# Patient Record
Sex: Female | Born: 1935 | Race: White | Hispanic: No | Marital: Single
Health system: Southern US, Community
[De-identification: ages and names within clinical notes are randomized; demographics above are authoritative.]

---

## 2004-05-27 ENCOUNTER — Ambulatory Visit: Payer: Self-pay | Admitting: Internal Medicine

## 2004-06-13 ENCOUNTER — Other Ambulatory Visit: Payer: Self-pay

## 2004-06-13 ENCOUNTER — Ambulatory Visit: Payer: Self-pay | Admitting: Internal Medicine

## 2005-02-06 ENCOUNTER — Ambulatory Visit: Payer: Self-pay | Admitting: Internal Medicine

## 2005-02-06 ENCOUNTER — Inpatient Hospital Stay: Payer: Self-pay | Admitting: Internal Medicine

## 2005-02-11 ENCOUNTER — Other Ambulatory Visit: Payer: Self-pay

## 2005-02-12 ENCOUNTER — Other Ambulatory Visit: Payer: Self-pay

## 2005-06-10 ENCOUNTER — Ambulatory Visit: Payer: Self-pay | Admitting: Internal Medicine

## 2005-07-03 ENCOUNTER — Ambulatory Visit: Payer: Self-pay | Admitting: Internal Medicine

## 2005-07-16 ENCOUNTER — Ambulatory Visit: Payer: Self-pay | Admitting: General Surgery

## 2005-07-27 ENCOUNTER — Inpatient Hospital Stay: Payer: Self-pay | Admitting: General Surgery

## 2005-08-01 ENCOUNTER — Other Ambulatory Visit: Payer: Self-pay

## 2005-08-02 ENCOUNTER — Other Ambulatory Visit: Payer: Self-pay

## 2005-08-18 ENCOUNTER — Emergency Department: Payer: Self-pay | Admitting: Internal Medicine

## 2005-08-18 ENCOUNTER — Other Ambulatory Visit: Payer: Self-pay

## 2005-08-27 ENCOUNTER — Ambulatory Visit: Payer: Self-pay | Admitting: Internal Medicine

## 2005-11-14 ENCOUNTER — Other Ambulatory Visit: Payer: Self-pay

## 2005-11-14 ENCOUNTER — Emergency Department: Payer: Self-pay | Admitting: Emergency Medicine

## 2006-07-13 ENCOUNTER — Ambulatory Visit: Payer: Self-pay | Admitting: Internal Medicine

## 2006-12-07 ENCOUNTER — Other Ambulatory Visit: Payer: Self-pay

## 2006-12-08 ENCOUNTER — Inpatient Hospital Stay: Payer: Self-pay | Admitting: Internal Medicine

## 2007-03-16 ENCOUNTER — Inpatient Hospital Stay: Payer: Self-pay | Admitting: General Surgery

## 2007-03-18 ENCOUNTER — Other Ambulatory Visit: Payer: Self-pay

## 2007-07-15 ENCOUNTER — Ambulatory Visit: Payer: Self-pay | Admitting: Internal Medicine

## 2007-07-27 IMAGING — CT CT ABD-PELV W/ CM
1 of 2 series · 15 of 32 positions shown, 19 images · non-contrast
Comparison: none

REASON FOR EXAM: (1) Lt lower q abscess/diverticulitis Follow up; (2) Abscee
COMMENTS:

[Series 2: abdomen · axial · 0.59mm/px · z∈[-1056,-672]mm · 15 of 53 slices shown, 19 images]
[im 3/53  soft-tissue]
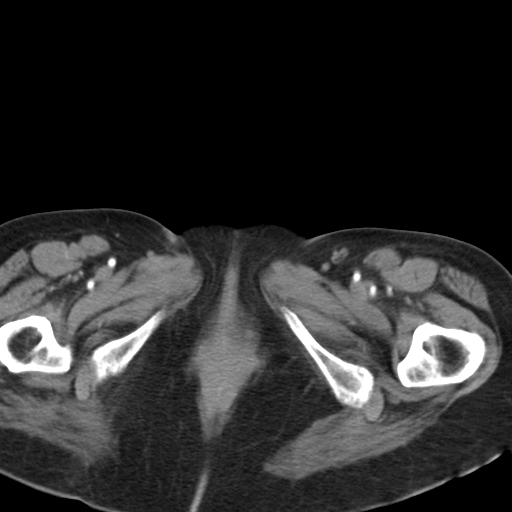
[im 3/53  bone]
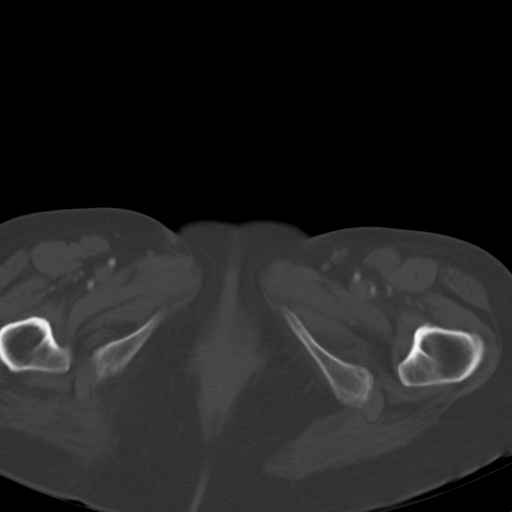
[im 7/53  soft-tissue]
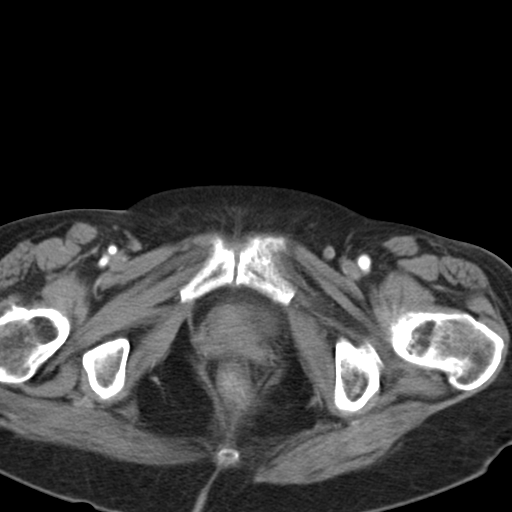
[im 11/53  soft-tissue]
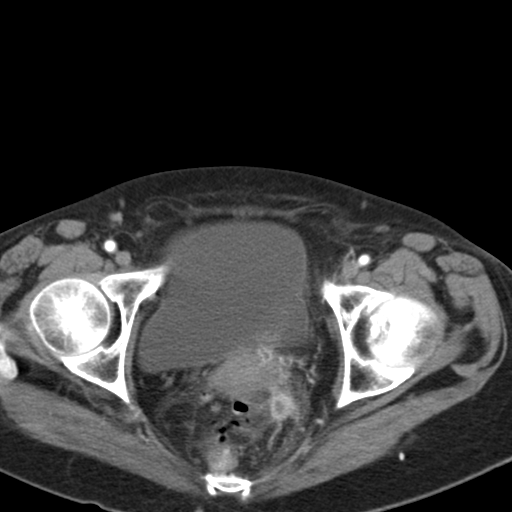
[im 15/53  soft-tissue]
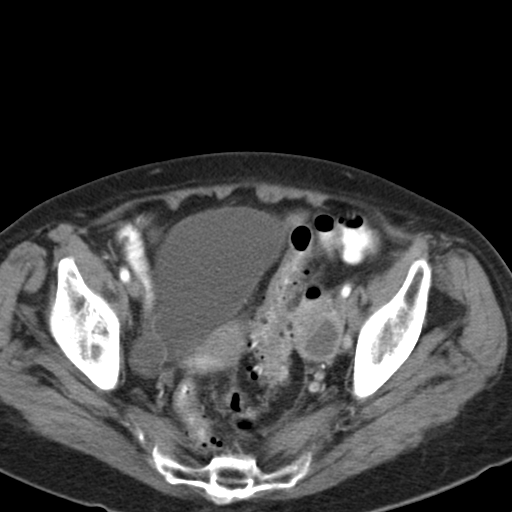
[im 19/53  soft-tissue]
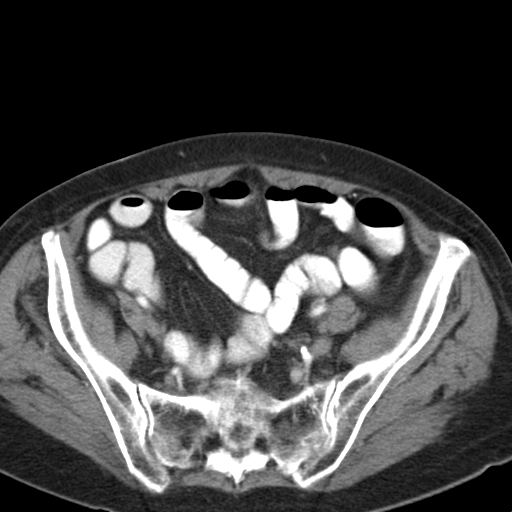
[im 23/53  soft-tissue]
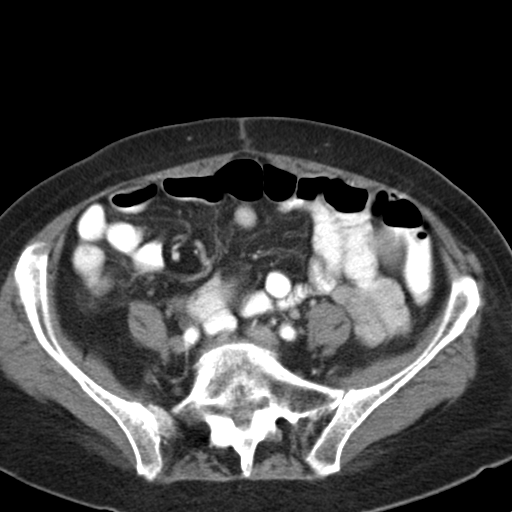
[im 27/53  soft-tissue]
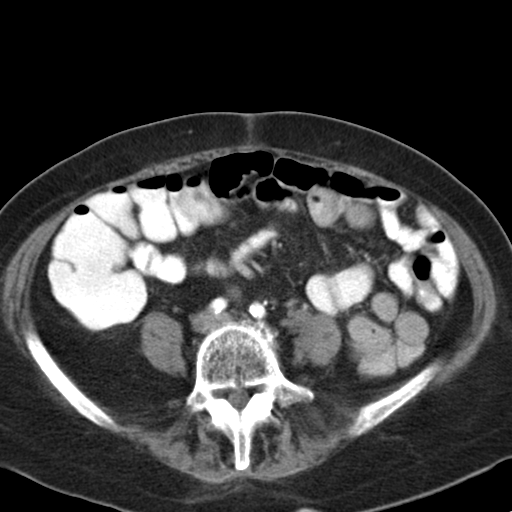
[im 31/53  soft-tissue]
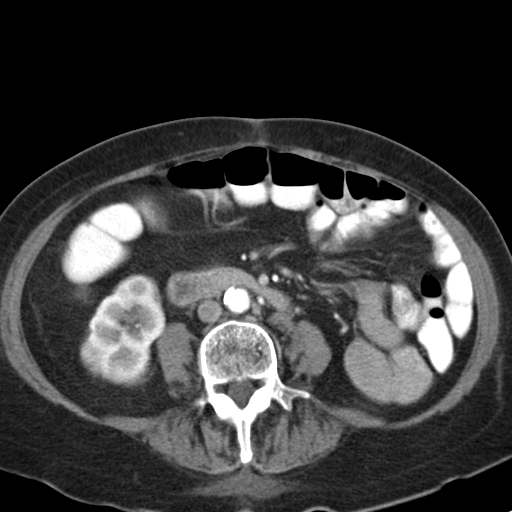
[im 35/53  soft-tissue]
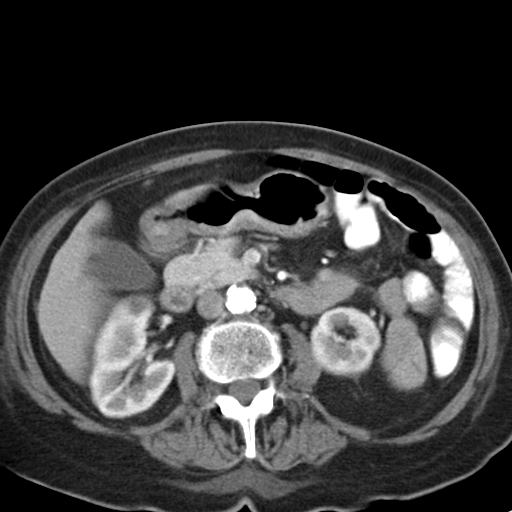
[im 35/53  bone]
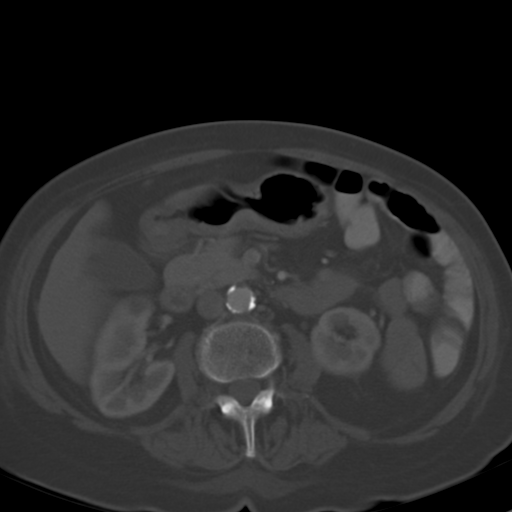
[im 39/53  soft-tissue]
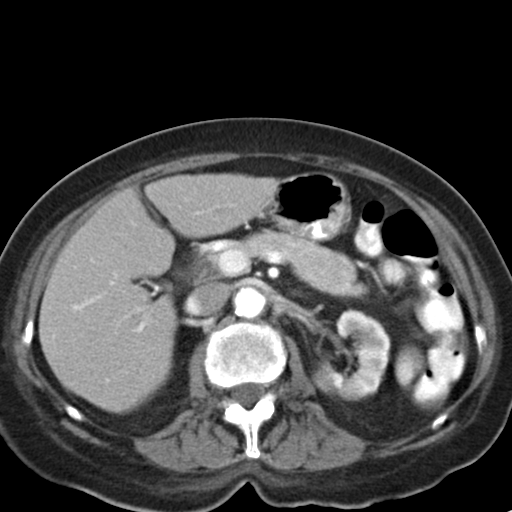
[im 43/53  soft-tissue]
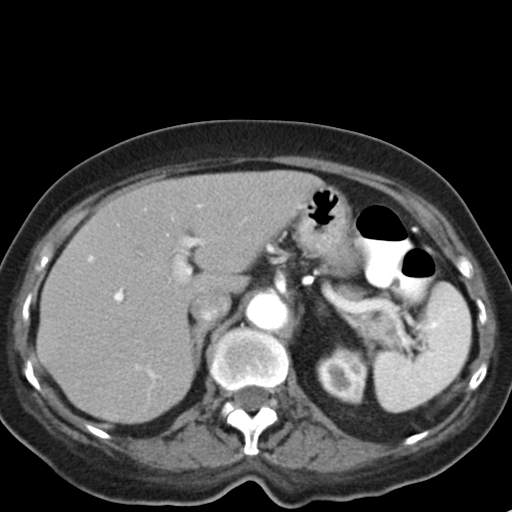
[im 45/53  lung]
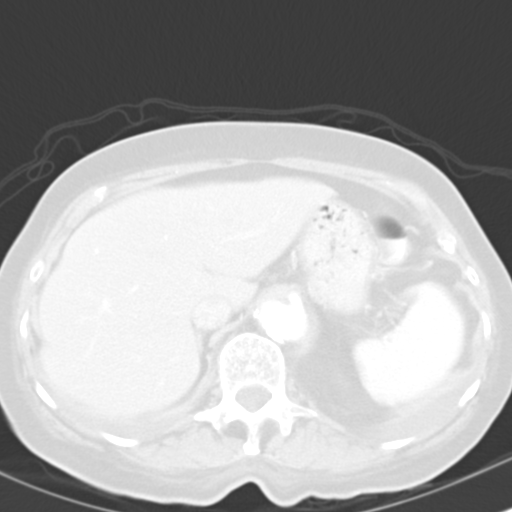
[im 47/53  soft-tissue]
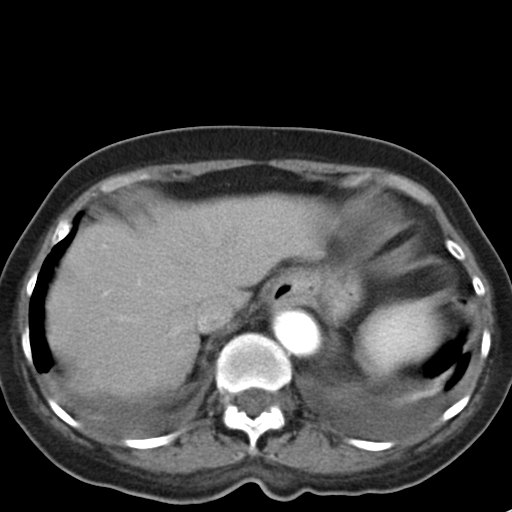
[im 47/53  lung]
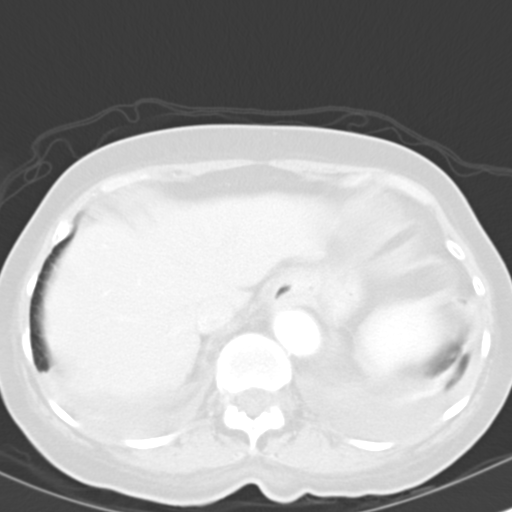
[im 49/53  lung]
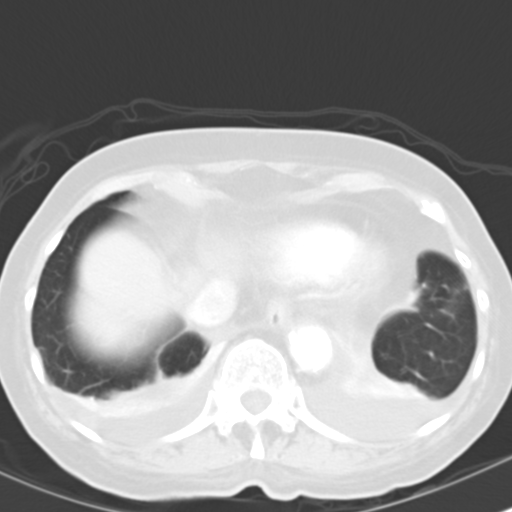
[im 51/53  soft-tissue]
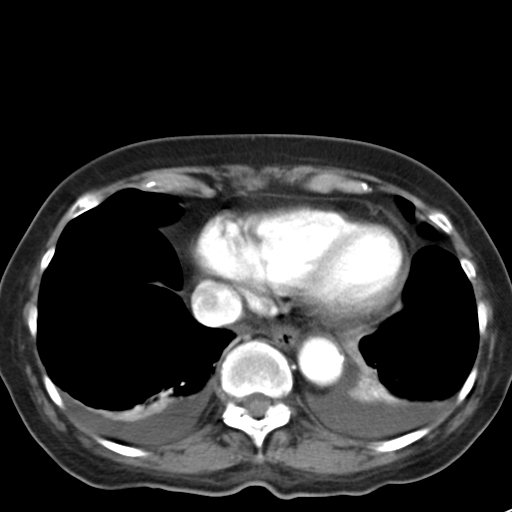
[im 51/53  lung]
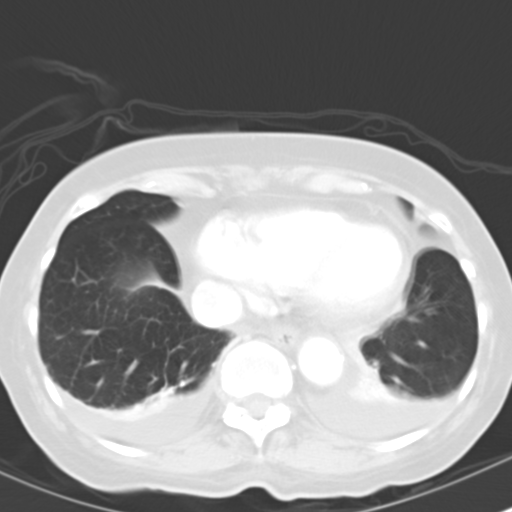

[15 of 32 positions shown; findings below may reference images not displayed]

PROCEDURE:     CT  - CT ABDOMEN / PELVIS  W  - February 19, 2005  [DATE]

RESULT:     Comparison is made to study 12 February, 2005.

Since the prior study the bilateral pleural effusions have increased in
size.  There is atelectasis in the adjacent lower lobes of each lung.  In
the pelvis there is evidence of sigmoid diverticulosis and lateral to the
sigmoid colon there is a fluid and gas-filled structure seen that is
consistent with the patient's known abscess. It measures approximately 2.5 x
3 cm in diameter with an additional area measuring approximately 1.5 x 2 cm
closely applied to it.  Overall, the appearance of the pelvic structures is
felt to be slowly improving.  I do not see free fluid in the cul-de-sac
region. The uterus is small, appropriate for age. The urinary bladder is
moderately distended and grossly normal.

The partially contrast filled loops of small and large bowel outside of the
sigmoid region appeared normal. The liver, gallbladder, non-distended
stomach, pancreas, spleen, and adrenal glands are normal in appearance. The
caliber of the abdominal aorta is grossly normal. The kidneys enhance well
and exhibit no evidence of obstruction.

The lung bases reveal the aforementioned basilar atelectasis with pleural
effusions that remain small, but which have increased in size since the
prior study.
IMPRESSION: 1)There remains an abscess in the LEFT aspect of the pelvis consistent with
the clinically known diverticular abscess.  The overall degree of
inflammation does appear to be decreasing.  No free fluid is seen and there
is no bowel obstruction identified.

2)There are small bilateral pleural effusions which have increased in size
since the prior study and there is basilar atelectasis bilaterally that is
also somewhat more conspicuous.

## 2007-07-31 IMAGING — CR DG CHEST 2V
1 series · 2 of 2 positions shown · non-contrast
Comparison: none

REASON FOR EXAM: Pleural effusions
COMMENTS:

[Series 3918: lateral · 0.22mm/px · 2 of 2 slices shown]
[im 1/2]
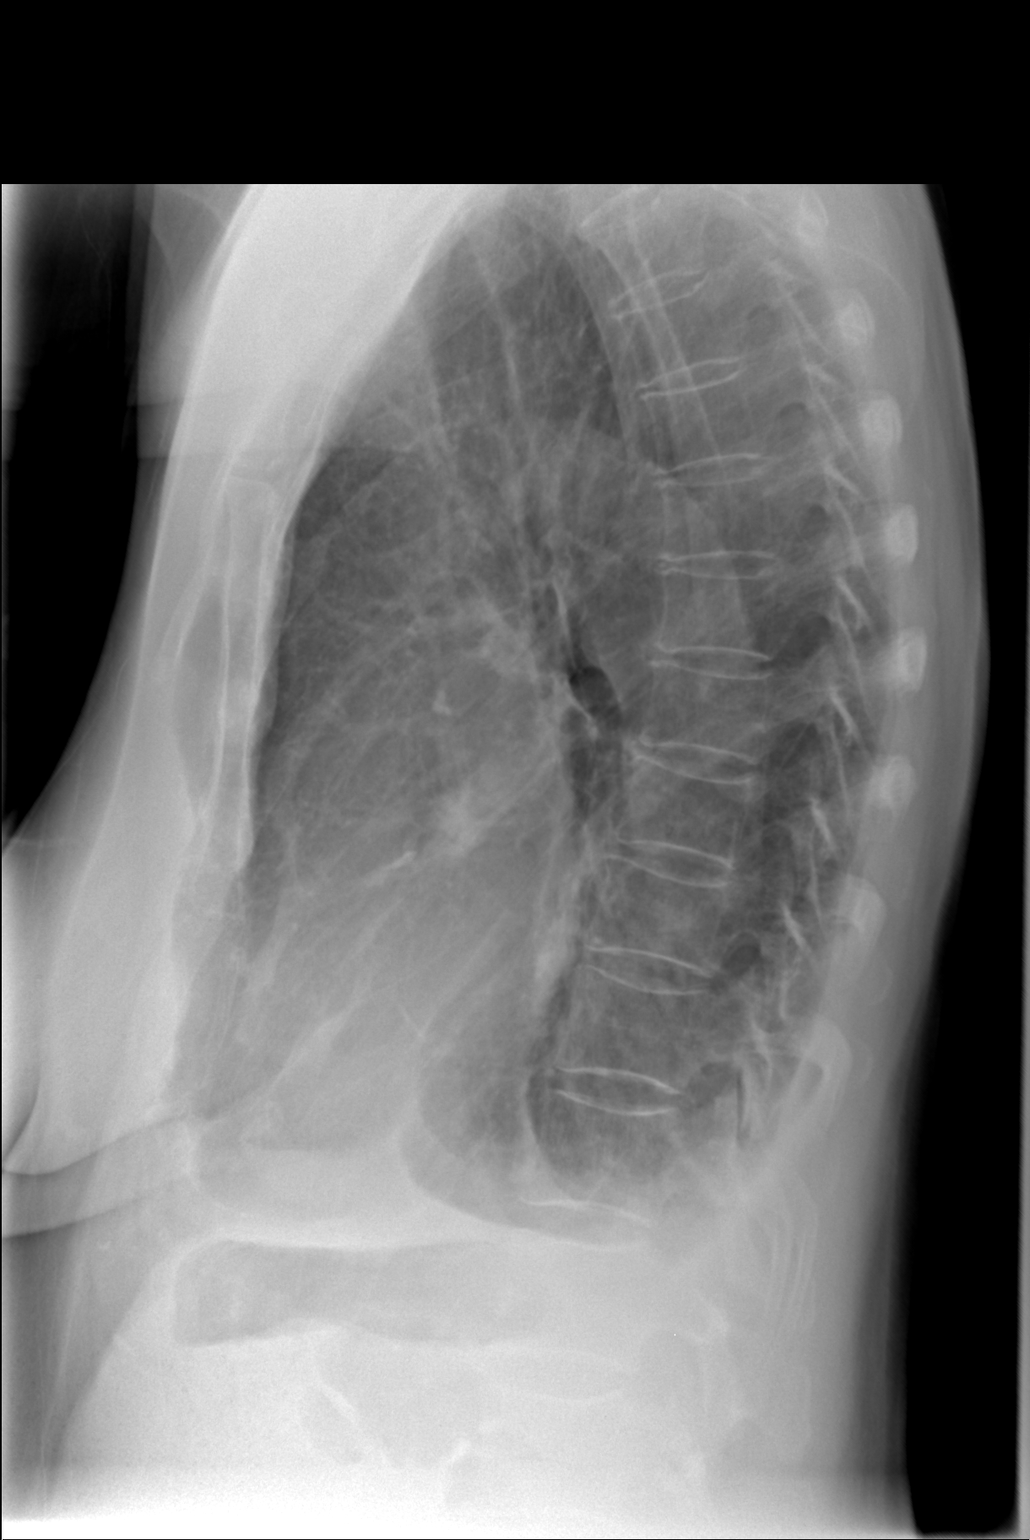
[im 2/2]
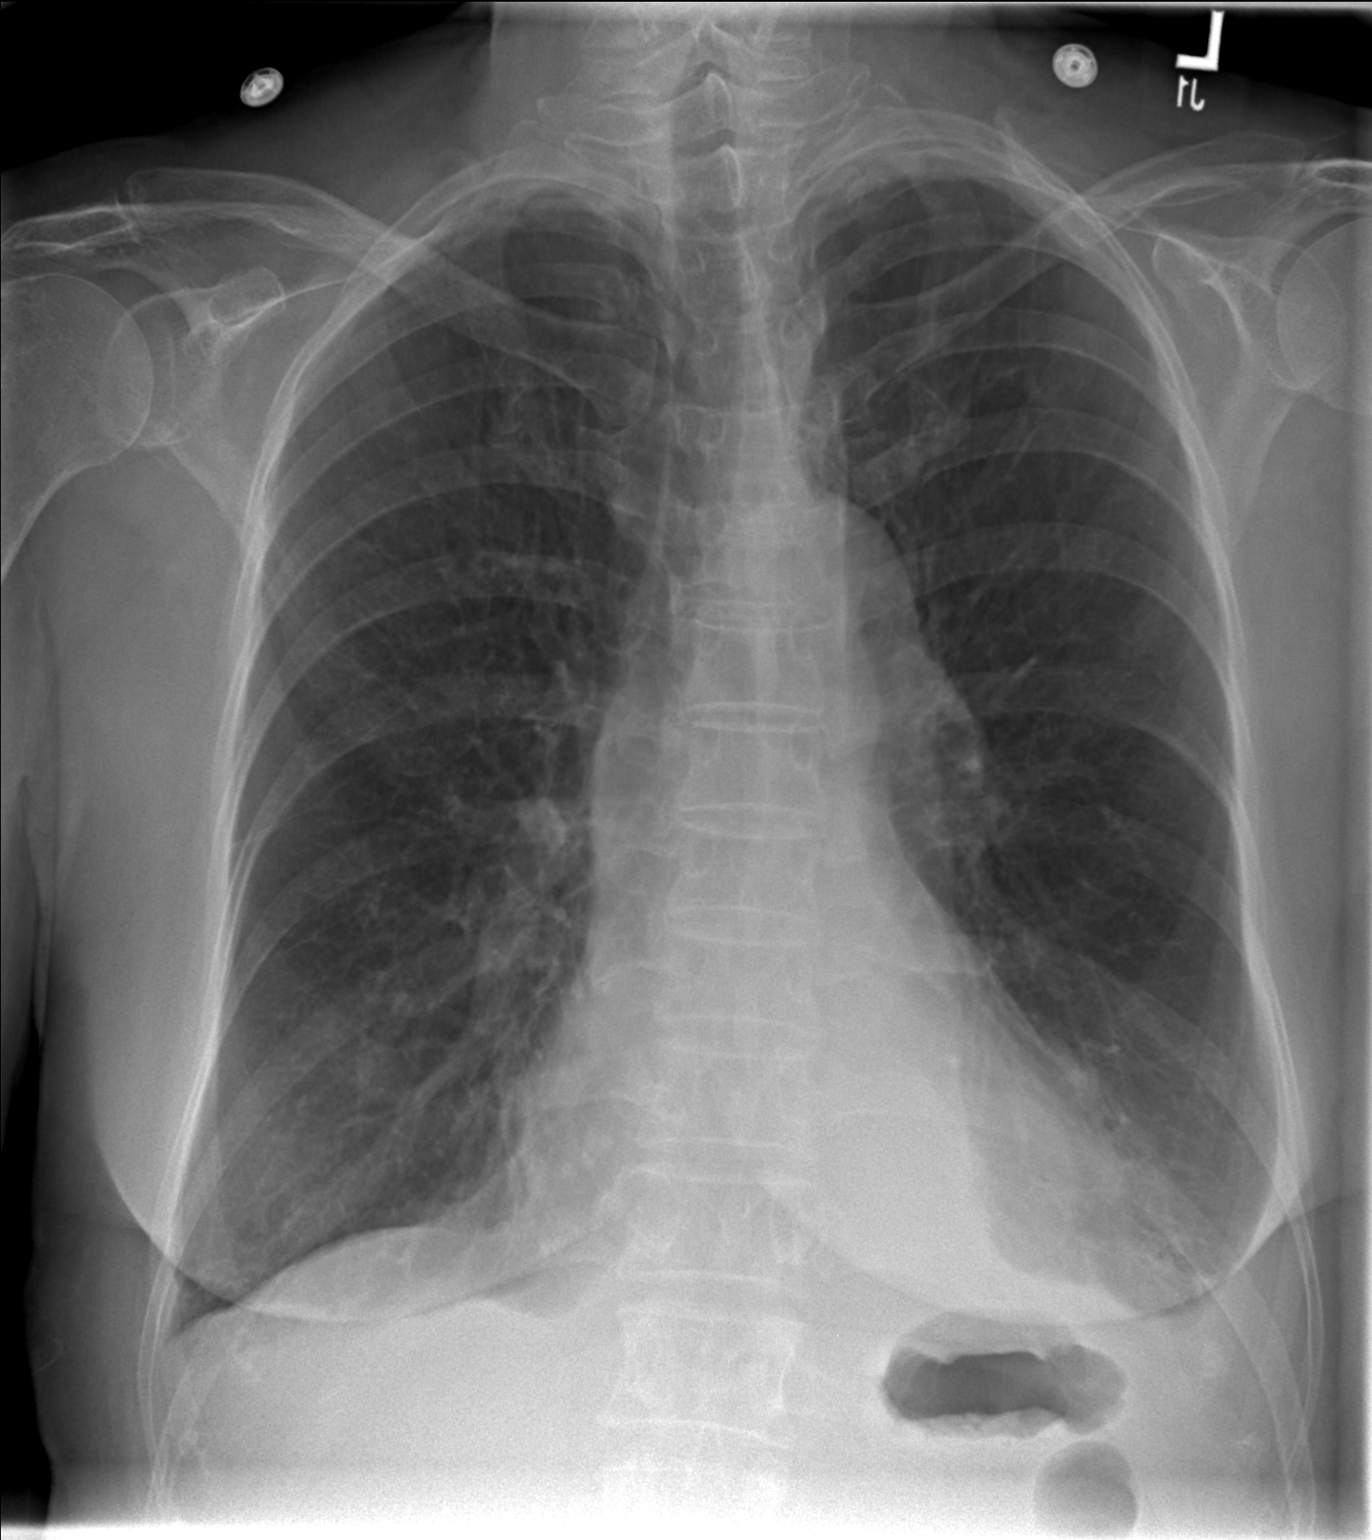

[2 of 2 positions shown; findings below may reference images not displayed]

PROCEDURE:     DXR - DXR CHEST PA (OR AP) AND LATERAL  - February 23, 2005  [DATE]

RESULT:     The lungs are hyperinflated. There is blunting of the LEFT
costophrenic angle. No focal regions of consolidation are demonstrated. The
cardiac silhouette is moderately enlarged. The visualized bony skeleton
demonstrates no evidence of fracture or dislocation.
IMPRESSION: 1.     Findings consistent with COPD.
2.     Blunting of the LEFT costophrenic angle likely representing a small
effusion though chronic scar cannot be completely excluded but this appears
to have occurred in the interim when compared to the previous study dated
02/13/2005.
[DATE].     No further focal regions of consolidation or focal infiltrates are
appreciated.

## 2007-12-21 IMAGING — CT CT ABD-PELV W/ CM
1 of 2 series · 15 of 32 positions shown, 19 images · non-contrast
Comparison: none

REASON FOR EXAM: Pelvic abscess
COMMENTS:

[Series 2: abdomen · axial · 0.70mm/px · z∈[+86,+462]mm · 15 of 53 slices shown, 19 images]
[im 3/53  soft-tissue]
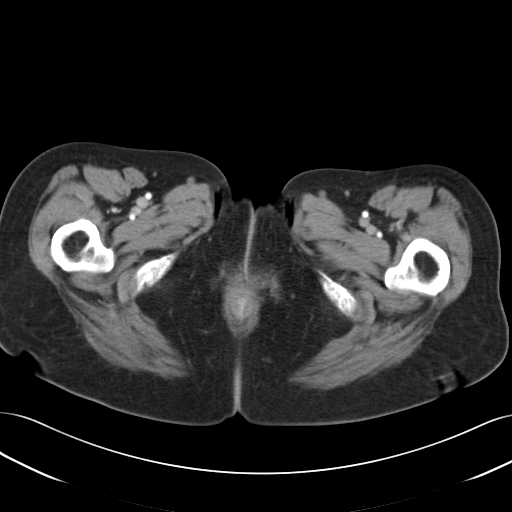
[im 3/53  bone]
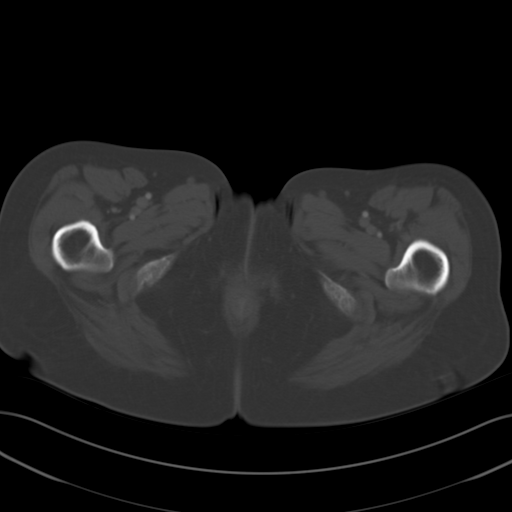
[im 7/53  soft-tissue]
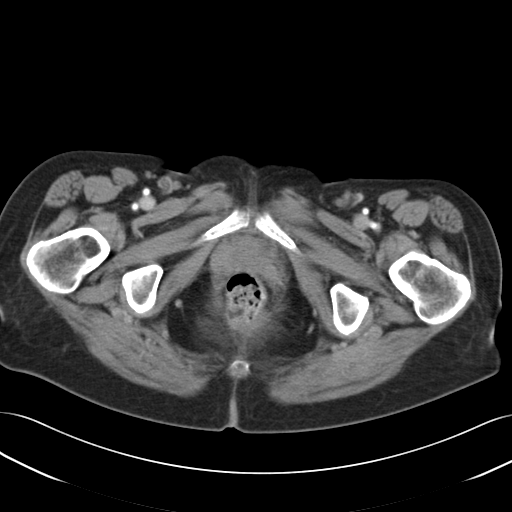
[im 11/53  soft-tissue]
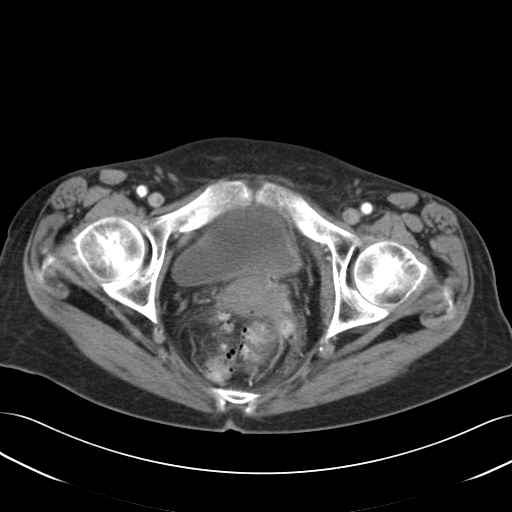
[im 15/53  soft-tissue]
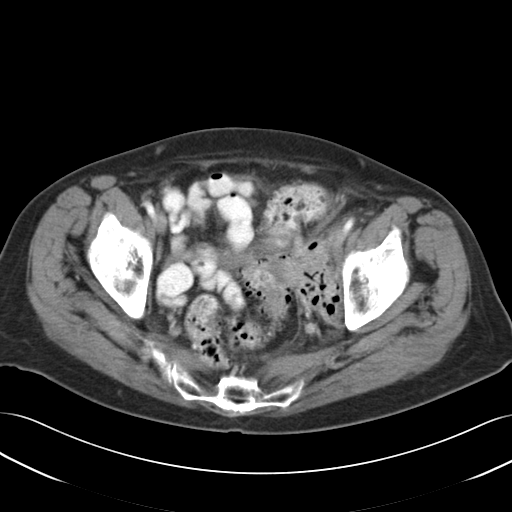
[im 19/53  soft-tissue]
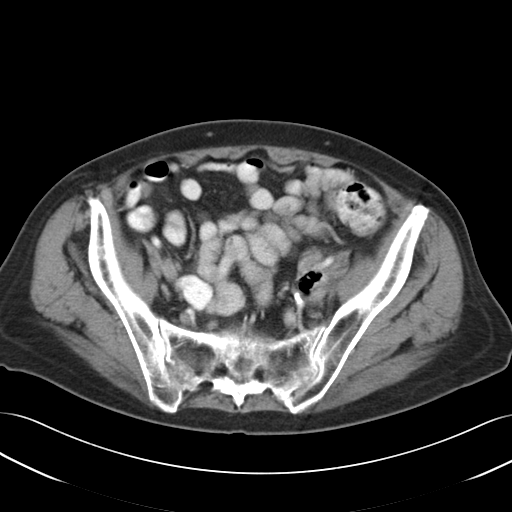
[im 23/53  soft-tissue]
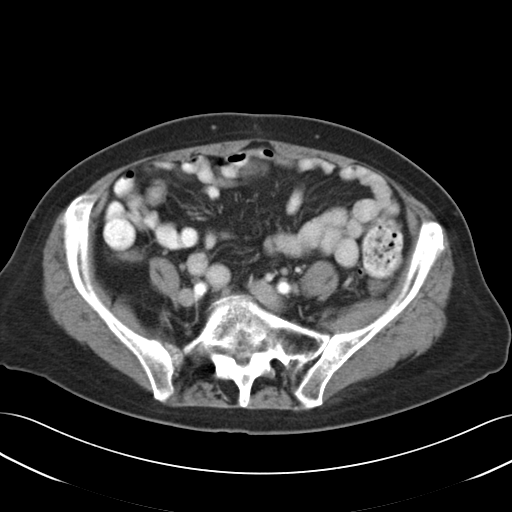
[im 28/53  soft-tissue]
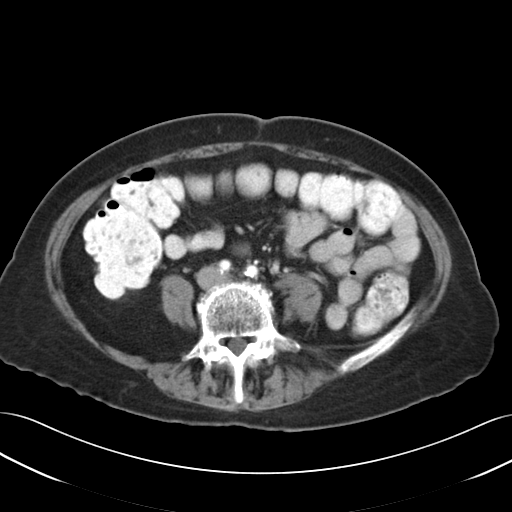
[im 30/53  soft-tissue]
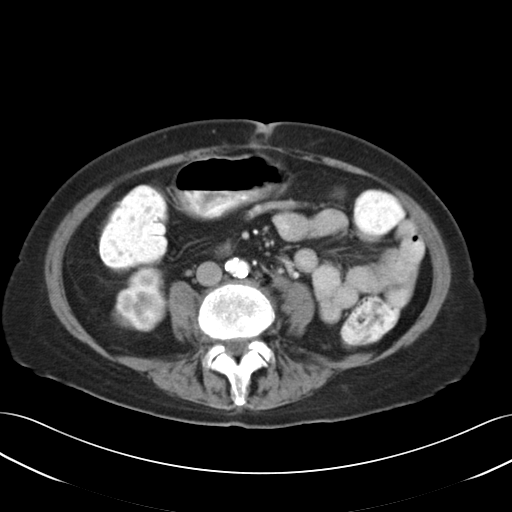
[im 34/53  soft-tissue]
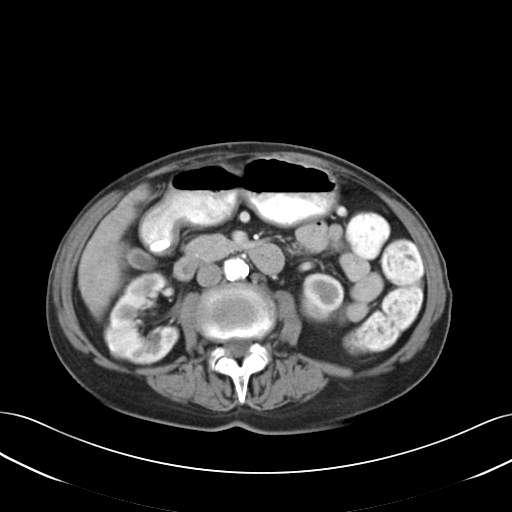
[im 34/53  bone]
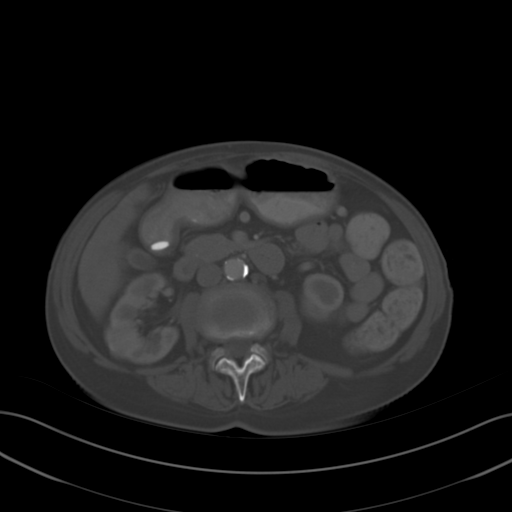
[im 38/53  soft-tissue]
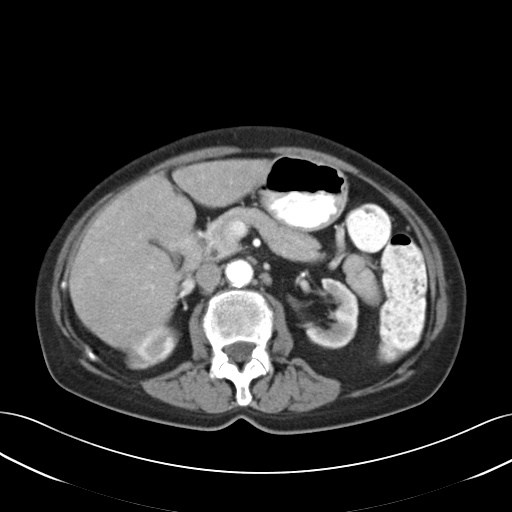
[im 42/53  soft-tissue]
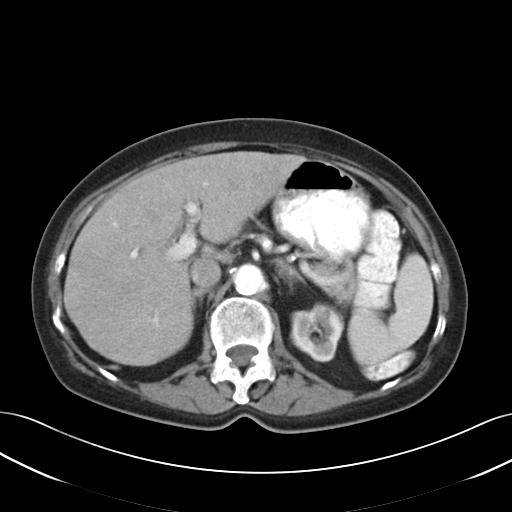
[im 44/53  lung]
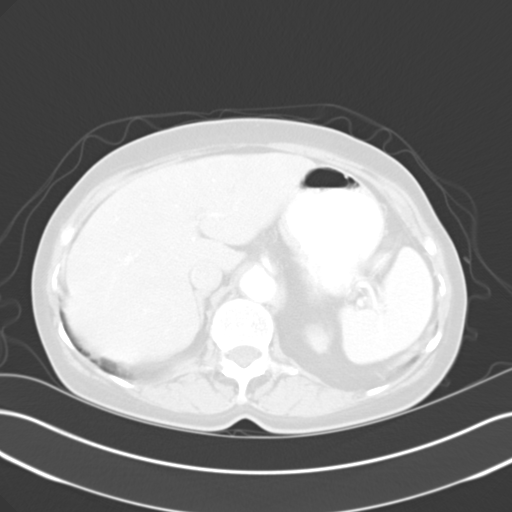
[im 46/53  soft-tissue]
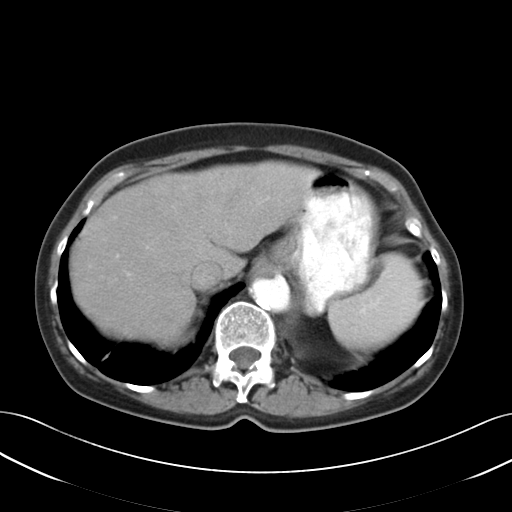
[im 46/53  lung]
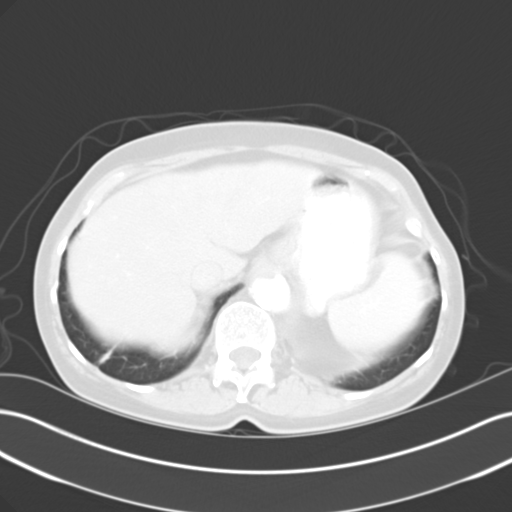
[im 48/53  lung]
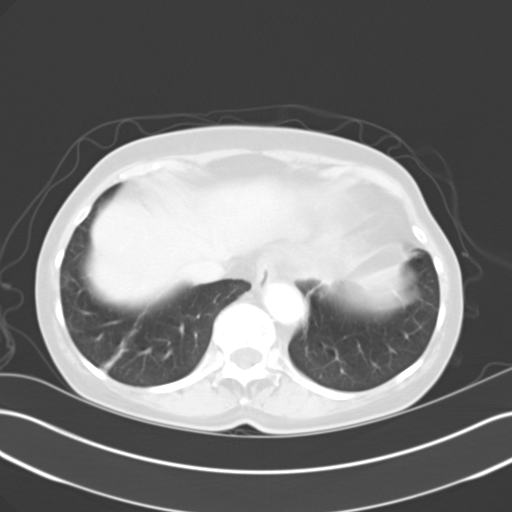
[im 50/53  soft-tissue]
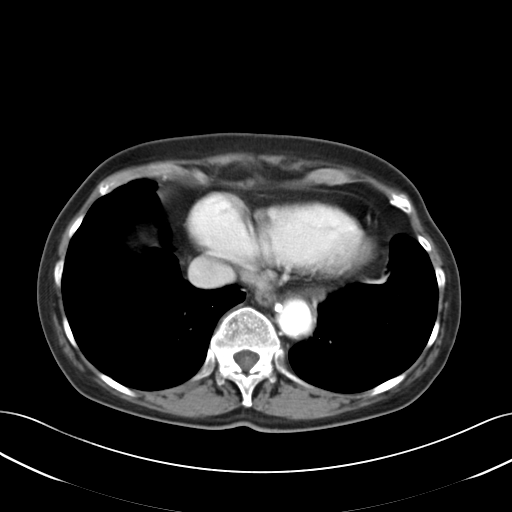
[im 50/53  lung]
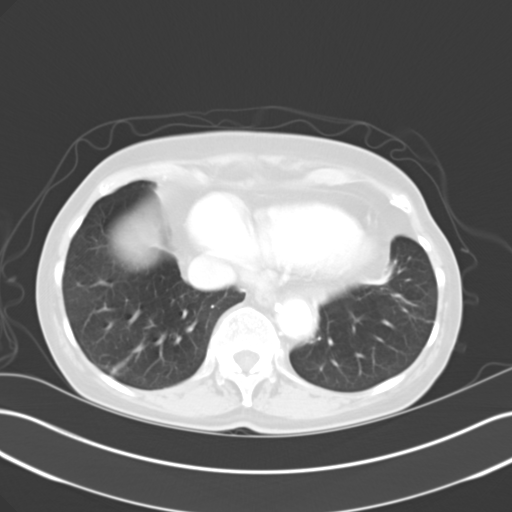

[15 of 32 positions shown; findings below may reference images not displayed]

PROCEDURE:     CT  - CT ABDOMEN / PELVIS  W  - July 16, 2005  [DATE]

RESULT:        The patient is being evaluated for known diverticular
abscess.  Comparison is made to a CT of the abdomen 10 June, 2005.

In the LEFT adnexal region there remains a poorly marginated collection of
air and soft tissue density that is little changed from the prior study.  It
is closely applied to the LEFT aspect of the uterine fundus and to the LEFT
aspect of the urinary bladder.  It measures approximately 6.0 cm AP x
approximately 4.2 cm transversely and extends longitudinally for a distance
of approximately 7.0 cm.  Overall, it does not appear significantly changed
since the prior study.  I see no free fluid more inferiorly in the pelvis.
Numerous sigmoid diverticula are present.  No obstructive changes are
demonstrated.

The remainder of the partially contrast-filled loops of small and large
bowel are normal in appearance.  There is a small hiatal hernia.  The
stomach, liver, spleen, adrenal glands and pancreas exhibit no acute
abnormality.  The kidneys enhance well and demonstrate age-appropriate
cortical thinning.   The caliber of the abdominal aorta is normal.  There is
calcification within its wall.  I see no paraaortic nor pericaval
lymphadenopathy.  The lung bases exhibit no acute abnormality.
IMPRESSION: 1.     There are findings consistent with a persistent, stable appearing,
diverticular abscess in the LEFT adnexal region.  There is no evidence of
obstruction nor any high-grade mass effect upon adjacent normal structures
but the abscess is closely applied to the LEFT aspect of the uterine fundus
and to the LEFT aspect of the urinary bladder.
2.     I do not see acute abnormality elsewhere within the abdomen.

## 2007-12-27 ENCOUNTER — Inpatient Hospital Stay: Payer: Self-pay | Admitting: Internal Medicine

## 2008-07-16 ENCOUNTER — Ambulatory Visit: Payer: Self-pay | Admitting: Internal Medicine

## 2009-07-19 ENCOUNTER — Ambulatory Visit: Payer: Self-pay | Admitting: Internal Medicine

## 2010-08-01 ENCOUNTER — Ambulatory Visit: Payer: Self-pay | Admitting: Internal Medicine

## 2010-11-19 ENCOUNTER — Ambulatory Visit: Payer: Self-pay | Admitting: Internal Medicine

## 2011-01-27 ENCOUNTER — Inpatient Hospital Stay: Payer: Self-pay | Admitting: Internal Medicine

## 2011-01-27 LAB — URINALYSIS, COMPLETE
Nitrite: POSITIVE
Ph: 6 (ref 4.5–8.0)
Protein: NEGATIVE
RBC,UR: 1 /HPF (ref 0–5)
Squamous Epithelial: <1
WBC UR: 250 /HPF (ref 0–5)

## 2011-01-27 LAB — CBC WITH DIFFERENTIAL/PLATELET
Basophil #: 0.1 10*3/uL (ref 0.0–0.1)
Eosinophil #: 0.4 10*3/uL (ref 0.0–0.7)
HGB: 13.1 g/dL (ref 12.0–16.0)
Lymphocyte #: 1.6 10*3/uL (ref 1.0–3.6)
Lymphocyte %: 16.2 %
MCH: 31.7 pg (ref 26.0–34.0)
MCHC: 33.6 g/dL (ref 32.0–36.0)
Monocyte %: 8.5 %
Neutrophil %: 69.7 %
Platelet: 246 10*3/uL (ref 150–440)
RDW: 13.4 % (ref 11.5–14.5)
WBC: 10 10*3/uL (ref 3.6–11.0)

## 2011-01-27 LAB — BASIC METABOLIC PANEL
BUN: 15 mg/dL (ref 7–18)
Creatinine: 1.23 mg/dL (ref 0.60–1.30)
EGFR (African American): 54 — ABNORMAL LOW
EGFR (Non-African Amer.): 45 — ABNORMAL LOW
Glucose: 108 mg/dL — ABNORMAL HIGH (ref 65–99)
Osmolality: 266 (ref 275–301)
Potassium: 3.3 mmol/L — ABNORMAL LOW (ref 3.5–5.1)

## 2011-01-27 LAB — CK TOTAL AND CKMB (NOT AT ARMC)
CK, Total: 98 U/L (ref 21–215)
CK-MB: 2.3 ng/mL (ref 0.5–3.6)

## 2011-01-29 LAB — BASIC METABOLIC PANEL
Calcium, Total: 8.6 mg/dL (ref 8.5–10.1)
Co2: 30 mmol/L (ref 21–32)
Creatinine: 1.15 mg/dL (ref 0.60–1.30)
EGFR (Non-African Amer.): 48 — ABNORMAL LOW
Potassium: 3.8 mmol/L (ref 3.5–5.1)
Sodium: 141 mmol/L (ref 136–145)

## 2011-01-29 LAB — CBC WITH DIFFERENTIAL/PLATELET
Basophil #: 0.1 10*3/uL (ref 0.0–0.1)
Basophil %: 1 %
Eosinophil #: 0.4 10*3/uL (ref 0.0–0.7)
HCT: 34.8 % — ABNORMAL LOW (ref 35.0–47.0)
HGB: 11.6 g/dL — ABNORMAL LOW (ref 12.0–16.0)
Lymphocyte #: 1.7 10*3/uL (ref 1.0–3.6)
MCH: 31.6 pg (ref 26.0–34.0)
MCHC: 33.3 g/dL (ref 32.0–36.0)
MCV: 95 fL (ref 80–100)
Monocyte #: 0.7 10*3/uL (ref 0.0–0.7)
Neutrophil #: 3.8 10*3/uL (ref 1.4–6.5)
Platelet: 217 10*3/uL (ref 150–440)
RDW: 13.4 % (ref 11.5–14.5)

## 2011-01-29 LAB — URINE CULTURE

## 2011-01-31 LAB — BASIC METABOLIC PANEL
Anion Gap: 12 (ref 7–16)
Calcium, Total: 8.3 mg/dL — ABNORMAL LOW (ref 8.5–10.1)
Co2: 25 mmol/L (ref 21–32)
EGFR (African American): 59 — ABNORMAL LOW

## 2011-01-31 LAB — CBC WITH DIFFERENTIAL/PLATELET
Basophil %: 0 %
HCT: 34.1 % — ABNORMAL LOW (ref 35.0–47.0)
HGB: 11.2 g/dL — ABNORMAL LOW (ref 12.0–16.0)
MCH: 31.3 pg (ref 26.0–34.0)
MCHC: 32.9 g/dL (ref 32.0–36.0)
Monocyte #: 0.1 10*3/uL (ref 0.0–0.7)
RBC: 3.59 10*6/uL — ABNORMAL LOW (ref 3.80–5.20)
RDW: 14 % (ref 11.5–14.5)

## 2011-02-01 LAB — CULTURE, BLOOD (SINGLE)

## 2011-02-01 LAB — EXPECTORATED SPUTUM ASSESSMENT W GRAM STAIN, RFLX TO RESP C

## 2011-02-02 LAB — BASIC METABOLIC PANEL
Anion Gap: 11 (ref 7–16)
BUN: 27 mg/dL — ABNORMAL HIGH (ref 7–18)
Calcium, Total: 8.3 mg/dL — ABNORMAL LOW (ref 8.5–10.1)
EGFR (Non-African Amer.): 49 — ABNORMAL LOW
Glucose: 114 mg/dL — ABNORMAL HIGH (ref 65–99)
Osmolality: 285 (ref 275–301)
Potassium: 4.5 mmol/L (ref 3.5–5.1)
Sodium: 140 mmol/L (ref 136–145)

## 2011-02-02 LAB — HEMOGLOBIN: HGB: 11.6 g/dL — ABNORMAL LOW (ref 12.0–16.0)

## 2011-02-04 LAB — CBC WITH DIFFERENTIAL/PLATELET
Basophil #: 0 10*3/uL (ref 0.0–0.1)
Basophil %: 0 %
HCT: 37.9 % (ref 35.0–47.0)
Lymphocyte #: 1.4 10*3/uL (ref 1.0–3.6)
Lymphocyte %: 10 %
MCHC: 32.7 g/dL (ref 32.0–36.0)
Monocyte %: 6.1 %
Neutrophil #: 11.5 10*3/uL — ABNORMAL HIGH (ref 1.4–6.5)
Neutrophil %: 83.8 %
Platelet: 244 10*3/uL (ref 150–440)
RDW: 14.1 % (ref 11.5–14.5)
WBC: 13.8 10*3/uL — ABNORMAL HIGH (ref 3.6–11.0)

## 2011-02-04 LAB — BASIC METABOLIC PANEL
Anion Gap: 9 (ref 7–16)
BUN: 28 mg/dL — ABNORMAL HIGH (ref 7–18)
Calcium, Total: 8.9 mg/dL (ref 8.5–10.1)
Creatinine: 1.07 mg/dL (ref 0.60–1.30)
EGFR (African American): >60
EGFR (Non-African Amer.): 53 — ABNORMAL LOW
Osmolality: 282 (ref 275–301)

## 2011-06-06 ENCOUNTER — Ambulatory Visit: Payer: Self-pay | Admitting: Internal Medicine

## 2011-06-13 ENCOUNTER — Inpatient Hospital Stay: Payer: Self-pay | Admitting: Specialist

## 2011-06-13 LAB — URINALYSIS, COMPLETE
Bilirubin,UR: NEGATIVE
Blood: NEGATIVE
Glucose,UR: NEGATIVE mg/dL (ref 0–75)
Hyaline Cast: 5
Ketone: NEGATIVE
Nitrite: POSITIVE
Ph: 5 (ref 4.5–8.0)
Protein: NEGATIVE
Specific Gravity: 1.014 (ref 1.003–1.030)
Squamous Epithelial: NONE SEEN
WBC UR: 23 /HPF (ref 0–5)

## 2011-06-13 LAB — CBC
HGB: 13.5 g/dL (ref 12.0–16.0)
MCH: 30 pg (ref 26.0–34.0)
MCHC: 32 g/dL (ref 32.0–36.0)
MCV: 94 fL (ref 80–100)
Platelet: 135 10*3/uL — ABNORMAL LOW (ref 150–440)
WBC: 9.4 10*3/uL (ref 3.6–11.0)

## 2011-06-13 LAB — COMPREHENSIVE METABOLIC PANEL
Alkaline Phosphatase: 138 U/L — ABNORMAL HIGH (ref 50–136)
Anion Gap: 10 (ref 7–16)
BUN: 23 mg/dL — ABNORMAL HIGH (ref 7–18)
Bilirubin,Total: 0.8 mg/dL (ref 0.2–1.0)
Calcium, Total: 9.3 mg/dL (ref 8.5–10.1)
Chloride: 104 mmol/L (ref 98–107)
Creatinine: 1.38 mg/dL — ABNORMAL HIGH (ref 0.60–1.30)
EGFR (Non-African Amer.): 36 — ABNORMAL LOW
Osmolality: 284 (ref 275–301)
SGOT(AST): 39 U/L — ABNORMAL HIGH (ref 15–37)
SGPT (ALT): 33 U/L

## 2011-06-13 LAB — PROTIME-INR: INR: 0.9

## 2011-06-14 LAB — BASIC METABOLIC PANEL
Calcium, Total: 8.7 mg/dL (ref 8.5–10.1)
Chloride: 103 mmol/L (ref 98–107)
Creatinine: 1.23 mg/dL (ref 0.60–1.30)
EGFR (African American): 48 — ABNORMAL LOW
Glucose: 125 mg/dL — ABNORMAL HIGH (ref 65–99)
Osmolality: 287 (ref 275–301)
Potassium: 4.6 mmol/L (ref 3.5–5.1)
Sodium: 141 mmol/L (ref 136–145)

## 2011-06-14 LAB — CBC WITH DIFFERENTIAL/PLATELET
Basophil #: 0.1 10*3/uL (ref 0.0–0.1)
Eosinophil #: 0.1 10*3/uL (ref 0.0–0.7)
MCHC: 32.6 g/dL (ref 32.0–36.0)
MCV: 94 fL (ref 80–100)
Monocyte #: 0.9 x10 3/mm (ref 0.2–0.9)
Neutrophil #: 10.8 10*3/uL — ABNORMAL HIGH (ref 1.4–6.5)
Platelet: 122 10*3/uL — ABNORMAL LOW (ref 150–440)
RDW: 13.1 % (ref 11.5–14.5)
WBC: 12.9 10*3/uL — ABNORMAL HIGH (ref 3.6–11.0)

## 2011-06-15 LAB — BASIC METABOLIC PANEL
Anion Gap: 9 (ref 7–16)
BUN: 25 mg/dL — ABNORMAL HIGH (ref 7–18)
Calcium, Total: 8.3 mg/dL — ABNORMAL LOW (ref 8.5–10.1)
Chloride: 102 mmol/L (ref 98–107)
Co2: 25 mmol/L (ref 21–32)
Creatinine: 1.28 mg/dL (ref 0.60–1.30)
EGFR (African American): 46 — ABNORMAL LOW
EGFR (Non-African Amer.): 39 — ABNORMAL LOW
Osmolality: 277 (ref 275–301)
Sodium: 136 mmol/L (ref 136–145)

## 2011-06-15 LAB — CBC WITH DIFFERENTIAL/PLATELET
Basophil #: 0.1 10*3/uL (ref 0.0–0.1)
Basophil %: 0.4 %
Eosinophil %: 0.5 %
HCT: 35.8 % (ref 35.0–47.0)
HGB: 11.7 g/dL — ABNORMAL LOW (ref 12.0–16.0)
Lymphocyte #: 0.6 10*3/uL — ABNORMAL LOW (ref 1.0–3.6)
Lymphocyte %: 4.4 %
MCHC: 32.7 g/dL (ref 32.0–36.0)
MCV: 94 fL (ref 80–100)
Monocyte #: 1 x10 3/mm — ABNORMAL HIGH (ref 0.2–0.9)
Neutrophil #: 11.5 10*3/uL — ABNORMAL HIGH (ref 1.4–6.5)
Neutrophil %: 87.3 %
Platelet: 94 10*3/uL — ABNORMAL LOW (ref 150–440)
RBC: 3.81 10*6/uL (ref 3.80–5.20)
RDW: 13.1 % (ref 11.5–14.5)
WBC: 13.2 10*3/uL — ABNORMAL HIGH (ref 3.6–11.0)

## 2011-06-16 LAB — BASIC METABOLIC PANEL
Anion Gap: 11 (ref 7–16)
BUN: 31 mg/dL — ABNORMAL HIGH (ref 7–18)
Calcium, Total: 8.1 mg/dL — ABNORMAL LOW (ref 8.5–10.1)
Creatinine: 1.59 mg/dL — ABNORMAL HIGH (ref 0.60–1.30)
EGFR (Non-African Amer.): 30 — ABNORMAL LOW
Glucose: 95 mg/dL (ref 65–99)
Osmolality: 276 (ref 275–301)
Potassium: 4 mmol/L (ref 3.5–5.1)
Sodium: 135 mmol/L — ABNORMAL LOW (ref 136–145)

## 2011-06-16 LAB — URINE CULTURE

## 2011-06-16 LAB — PATHOLOGY REPORT

## 2011-06-17 LAB — HEMOGLOBIN: HGB: 10.4 g/dL — ABNORMAL LOW (ref 12.0–16.0)

## 2011-06-17 LAB — BASIC METABOLIC PANEL
Anion Gap: 11 (ref 7–16)
Calcium, Total: 8.3 mg/dL — ABNORMAL LOW (ref 8.5–10.1)
Co2: 23 mmol/L (ref 21–32)
Creatinine: 1.28 mg/dL (ref 0.60–1.30)
Glucose: 115 mg/dL — ABNORMAL HIGH (ref 65–99)
Sodium: 132 mmol/L — ABNORMAL LOW (ref 136–145)

## 2011-06-18 LAB — BASIC METABOLIC PANEL
Anion Gap: 10 (ref 7–16)
BUN: 21 mg/dL — ABNORMAL HIGH (ref 7–18)
Chloride: 100 mmol/L (ref 98–107)
Co2: 25 mmol/L (ref 21–32)
EGFR (Non-African Amer.): 53 — ABNORMAL LOW
Glucose: 94 mg/dL (ref 65–99)
Osmolality: 273 (ref 275–301)
Potassium: 3.6 mmol/L (ref 3.5–5.1)
Sodium: 135 mmol/L — ABNORMAL LOW (ref 136–145)

## 2011-06-18 LAB — HEMOGLOBIN: HGB: 10.1 g/dL — ABNORMAL LOW (ref 12.0–16.0)

## 2011-06-19 LAB — BASIC METABOLIC PANEL
BUN: 16 mg/dL (ref 7–18)
Calcium, Total: 8.2 mg/dL — ABNORMAL LOW (ref 8.5–10.1)
Chloride: 98 mmol/L (ref 98–107)
EGFR (Non-African Amer.): >60
Potassium: 3.1 mmol/L — ABNORMAL LOW (ref 3.5–5.1)
Sodium: 135 mmol/L — ABNORMAL LOW (ref 136–145)

## 2011-06-19 LAB — MAGNESIUM: Magnesium: 1.4 mg/dL — ABNORMAL LOW

## 2011-06-19 LAB — HEMOGLOBIN: HGB: 9.6 g/dL — ABNORMAL LOW (ref 12.0–16.0)

## 2011-06-20 LAB — PLATELET COUNT: Platelet: 172 10*3/uL (ref 150–440)

## 2011-06-21 LAB — HEMOGLOBIN: HGB: 9.7 g/dL — ABNORMAL LOW (ref 12.0–16.0)

## 2011-06-22 LAB — BASIC METABOLIC PANEL
BUN: 24 mg/dL — ABNORMAL HIGH (ref 7–18)
Co2: 25 mmol/L (ref 21–32)
EGFR (Non-African Amer.): >60
Glucose: 128 mg/dL — ABNORMAL HIGH (ref 65–99)
Osmolality: 281 (ref 275–301)
Potassium: 4.1 mmol/L (ref 3.5–5.1)
Sodium: 138 mmol/L (ref 136–145)

## 2011-06-24 ENCOUNTER — Encounter: Payer: Self-pay | Admitting: Internal Medicine

## 2011-07-06 ENCOUNTER — Encounter: Payer: Self-pay | Admitting: Internal Medicine

## 2011-07-06 ENCOUNTER — Ambulatory Visit: Payer: Self-pay | Admitting: Internal Medicine

## 2012-04-05 DEATH — deceased

## 2014-04-29 NOTE — Consult Note (Signed)
Brief Consult Note: Diagnosis: Right Femoral Fx, CHF, HTN, Afib, COPD, MVP.   Patient was seen by consultant.   Consult note dictated.   Recommend further assessment or treatment.   Orders entered.   Discussed with Attending MD.   Comments: 81 yo female with PMHx of CHF, HTN, Afib, COPD, MVP now with Right femoral Fx  1. Preop eval -  - patient with cardiac hx of CHF (ECHO 2007 with severe TR, moderate MR, moderate mitral valve prolapse), Afib - patient with Functional Capacity of 4-10 METS prior to fall - patient with 1-5% for cardiac event for orthopedic surgery - recommend to give her home beta blocker dose prior to surgery - check EKG after surgery - cont with O2 (4l) prior and post surgery - limit use of benzos, narcotics after surgery  - formal cardiac clearance recommended also  2.HTN - elevated, most likley secondary to pain - pain management per surgery - cont with home metoprolol and lasix  3. Hx of Afib - currently not on anticoagulation secondary to falls and Hx of GIB - cont with close monitoring and rate control  4. COPD\Chronic Resp Failure - cont with supplemental O2 (4L)  5. CHF - management as stated above - cont with beta blocker and lasix   FULL CODE PMD - Dr.Chaplain  Time spent evaluating patient = 40 minutes ZOX#[id number]#[id number].  Electronic Signatures: Stephanie AcreMungal, Ishanvi Mcquitty (MD)  (Signed 08-Jun-13 23:03)  Authored: Brief Consult Note   Last Updated: 08-Jun-13 23:03 by Stephanie AcreMungal, Noor Vidales (MD)

## 2014-04-29 NOTE — Consult Note (Signed)
Present Illness 81 year old female with known hypertension, valvular heart disease, chronic atrial fibrillation with home very hypertension, likely secondary to some lung disease who has had chronic gastrointestinal abnormalities, including diverticulosis.  She's had significant GI bleed in the past and has not been on anticoagulation for atrial fibrillation.  Due to this, GI bleed concerns.  She has had significant and good control of her heart rate in the mid 60-70 beat per minute range on current medical regimen including cardiacs TEN metoprolol.  She's also had some lower extremity edema which she is taking Lasix on occasion, but this has not been an issue recently.  The patient has had new onset of significant shortness of breath and was found to have a pneumonia, bronchitis with hypoxia.  His hypoxia has caused some concerns of atrial fibrillation with more rapid ventricular rate and shortness of breath.  Currently she is feeling better today on appropriate medications.  There is no evidence of acute myocardial infarction at this time  Family history Father died in his 21s of cerebrovascular accident and mother died in her 88s with a myocardial infarction  Social history The patient smokes 3 packs per day for 30 years and quit in 1984 and occasionally drinks alcohol   Physical Exam:   GEN WD    HEENT pink conjunctivae    NECK No masses    RESP wheezing  rhonchi  crackles    CARD Irregular rate and rhythm  Murmur    Murmur Systolic    Systolic Murmur axilla    ABD denies tenderness  soft    LYMPH negative neck    EXTR negative cyanosis/clubbing    SKIN No rashes    NEURO cranial nerves intact    PSYCH alert   Review of Systems:   Subjective/Chief Complaint I am short of breath    Respiratory: Short of breath    Review of Systems: All other systems were reviewed and found to be negative    Medications/Allergies Reviewed Medications/Allergies reviewed      Diverticulitis:    mumps:    Shingles:    htn:    afib:    cardioversion:    pmr:    rotator cuff tear:    rheumatic fever:    appendectomy:   Home Medications:  Mucinex 600 mg oral tablet, extended release: 1 tab(s) orally 2 times a day, Active  Hycodan 5 mg-1.5 mg/5 mL oral syrup: 1 teaspoonful (5 milliliters) orally every 6 hours as needed for cough., Active  diltiazem 180 mg/24 hours oral capsule, extended release: 1 cap(s) orally once a day, Active  metoprolol tartrate 100 mg oral tablet: 1 tab(s) orally 2 times a day, Active  furosemide 20 mg oral tablet: 1 tab(s) orally once a day, Active  multivitamin: 1 tab(s) orally once a day. **complete senior**, Active  Zithromax Z-Pak 250 mg oral tablet: 2 tab(s) orally once a day for 1 day, then 1 tab orally once a day x 4 days. **start date 01/26/11**, Active  Routine Chem:  22-Jan-13 08:02    Glucose, Serum 108   BUN 15   Creatinine (comp) 1.23   Sodium, Serum 132   Potassium, Serum 3.3   Chloride, Serum 93   CO2, Serum 27   Calcium (Total), Serum 9.5   Anion Gap 12   Osmolality (calc) 266   eGFR (African American) 54   eGFR (Non-African American) 45  Cardiac:  22-Jan-13 08:02    CK, Total 98   CPK-MB,  Serum 2.3  Routine Hem:  22-Jan-13 08:02    WBC (CBC) 10.0   RBC (CBC) 4.15   Hemoglobin (CBC) 13.1   Hematocrit (CBC) 39.1   Platelet Count (CBC) 246   MCV 94   MCH 31.7   MCHC 33.6   RDW 13.4   Neutrophil % 69.7   Lymphocyte % 16.2   Monocyte % 8.5   Eosinophil % 4.5   Basophil % 1.1   Neutrophil # 7.0   Lymphocyte # 1.6   Monocyte # 0.8   Eosinophil # 0.4   Basophil # 0.1  Cardiac:  22-Jan-13 08:02    Troponin I < 0.02  Cardiology:  22-Jan-13 09:08    Ventricular Rate 67   Atrial Rate 267   QRS Duration 82   QT 416   QTc 439   R Axis -146   T Axis 85  Routine UA:  22-Jan-13 12:14    Color (UA) Yellow   Clarity (UA) Cloudy   Glucose (UA) Negative   Bilirubin (UA) Negative   Ketones  (UA) Trace   Specific Gravity (UA) 1.011   Blood (UA) Negative   pH (UA) 6.0   Protein (UA) Negative   Nitrite (UA) Positive   Leukocyte Esterase (UA) 3+   RBC (UA) 1 /HPF   WBC (UA) 250 /HPF   Bacteria (UA) 3+   Epithelial Cells (UA) <1 /HPF   WBC Clump (UA) PRESENT   Mucous (UA) PRESENT   Hyaline Cast (UA) 1 /LPF  Lab:  23-Jan-13 07:20    O2 Saturation (Pulse Ox) 89   FiO2 (Pulse Ox) 0.21    Cipro: GI Distress, Other  Allegra: Unknown  Vital Signs/Nurse's Notes: **Vital Signs.:   23-Jan-13 01:26   Vital Signs Type Q 4hr   Temperature Temperature (F) 98.1   Celsius 36.7   Temperature Source oral   Pulse Pulse 85   Pulse source per Dinamap   Respirations Respirations 20   Systolic BP Systolic BP 774   Diastolic BP (mmHg) Diastolic BP (mmHg) 69   Mean BP 94   BP Source Dinamap   Pulse Ox % Pulse Ox % 92   Pulse Ox Activity Level  At rest   Oxygen Delivery 1.5     Impression 81 year old female with known chronic atrial fibrillation with previously controlled heart rate, now with mild elevation of heart rate due to illness, but continued reasonable control on outpatient medications without anticoagulation due to previous GI bleed with shortness of breath, more consistent with lung infection, pulmonary hypertension and valvular heart disease which is relatively stable and currently without evidence of acute myocardial infarction    Plan 1.  Continue outpatient doses of cardiac of 180 mg with 100 mg of metoprolol twice per day. 2.  We will follow for significant tachycardia and adjust dose as necessary, but if patient has a heart rate below 110.  It would be reasonable for her current illness X line 3.  No anticoagulation due to significant GI bleed in the recent past. 3.  Consider echocardiogram for LV systolic dysfunction, valvular heart disease, pulmonary hypertension contributing to above and adjustments as necessary. 4.  Lasix for risk reduction in pulmonary edema as  before. 5.  Begin ambulation and follow for her need and adjustments of medications   Electronic Signatures: Corey Skains (MD)  (Signed 23-Jan-13 12:17)  Authored: General Aspect/Present Illness, History and Physical Exam, Review of System, Past Medical History, Home Medications, Labs, Allergies, Vital Signs/Nurse's Notes,  Impression/Plan   Last Updated: 23-Jan-13 12:17 by Corey Skains (MD)

## 2014-04-29 NOTE — Consult Note (Signed)
PATIENT NAME:  Carly SkeensSEXTON, Kamie A MR#:  [id number] DATE OF BIRTH:  [birth year]  DATE OF EVALUATION:  02/04/2011   PROGRESS NOTE  SUBJECTIVE: The patient is doing much better with lung issues and has had improvements of shortness of breath. The patient's heart rate and blood pressure have been stable.   PHYSICAL EXAMINATION: Unchanged. Review of vital signs and other laboratories was occurring.   ASSESSMENT: Atrial fibrillation with controlled ventricular rate, which is chronic with hypertension, valvular heart disease stable, and new onset of acute pneumonia, which is significantly improved.   RECOMMENDATIONS:  1. Continue current medical regimen for heart rate control without change.  2. No change in medication management for hypertension control and ambulate, following for any further significant symptoms.  3. The patient is stable for discharge from the cardiac standpoint with followup next week.     ____________________________ Lamar BlinksBruce J. Lancelot Alyea, MD bjk:bjt D: 02/05/2011 08:43:54 ET T: 02/05/2011 10:23:12 ET JOB#: [id number]  cc: Lamar BlinksBruce J. Domnic Vantol, MD, <Dictator> Lamar BlinksBRUCE J Thyra Yinger MD ELECTRONICALLY SIGNED 02/20/2011 14:29

## 2014-04-29 NOTE — Discharge Summary (Signed)
PATIENT NAME:  Carly [name], Carly [name] MR#:  [id number] DATE OF BIRTH:  [birth year]  DATE OF ADMISSION:  06/13/2011 DATE OF DISCHARGE:  06/23/2011  FINAL DIAGNOSES:  1. Displaced subcapital fracture, right hip. 2. Hypertension.  3. Atrial fibrillation.  4. Chronic obstructive pulmonary disease.  5. History of congestive heart failure with echocardiogram in 2007 showing tricuspid regurgitation, mitral regurgitation, and mitral valve prolapse.  6. Mild dementia and depression.  OPERATIONS: On 06/14/2011 right hip hemiarthroplasty with Stryker Accolade unipolar prosthesis.   COMPLICATIONS: None.   CONSULTANTS:  1. [facility].  2. Adrian BlackwaterShaukat Khan, MD - Cardiology. 3. Kristine LineaJolanta Pucilowska, MD - Psychiatry. 4. Ned GraceNancy Phifer, MD - Palliative Care.  DISCHARGE MEDICATIONS:  1. Lasix 20 mg daily.  2. Lopressor 50 mg every 12 hours. 3. Pneumococcal vaccine. 4. Cardizem CD 240 mg daily. 5. Symbicort inhaler 2 puffs twice [name] day.  6. Spiriva inhaler 1 capsule inhalation daily.  7. Potassium 10 mEq daily. 8. Haldol 1 mg at bedtime.  9. Norco 5/325 mg 1 to 2 every six hours p.r.n. pain.  10. Enteric-coated aspirin one p.o. twice [name] day. 11. Prednisone taper 5 mg per day with [name] 25 mg dose tomorrow.  HISTORY OF PRESENT ILLNESS: The patient is an 81 year old female who fell at home early in the morning of admission. She tripped over [name] stool injuring her right hip. She was brought to the emergency room where exam and x-ray showed [name] displaced subcapital fracture of the right hip. She was admitted for medical evaluation and surgery. She was seen by [name] cardiologist, Dr. Welton FlakesKhan, and cleared for surgery. Risks and benefits and postoperative protocol were discussed with the patient's family at length. Dr. Conchita Parison Chaplin is her primary care doctor.   PAST MEDICAL HISTORY: 1. Diverticulitis.  2. Mumps. 3. Shingles. 4. Hypertension. 5. Atrial fibrillation with cardioversion.  6. Rheumatic fever.   PAST SURGICAL HISTORY:   1. Rotator cuff surgery.  2. Appendectomy.   ALLERGIES: Cipro and Allegra.   HOME MEDICATIONS: As above.  REVIEW OF SYSTEMS: Unremarkable.  FAMILY HISTORY: Unremarkable.   SOCIAL HISTORY: The patient lives with her husband. She does not smoke or drink.   PHYSICAL EXAMINATION: Vital signs were normal. The patient was awake, alert and cooperative and in cooperative. She had shortening and rotation of the right leg. There was pain with motion of the hip. The skin was intact and neurovascular status was intact.   LABORATORY DATA: Laboratory on admission was satisfactory.   HOSPITAL COURSE: She was cleared for surgery and on 06/14/2011 she underwent right hip hemiarthroplasty without complication. She had minimal blood loss. She had an admission hemoglobin of 13.5. This dropped to 10.1 on the fourth postoperative day and remained stable. She did not require transfusion. The patient had severe pulmonary problems during the hospitalization and was treated aggressively. She had some disorientation which cleared. She made slow progress with physical therapy. Her daughter lives in the [place] area and wished that she be sent to that area for rehab. By 06/23/2011 Dr. Candelaria Stagershaplin, her primary care doctor, felt that she was stable and ready for skilled nursing transfer. She can followup with an orthopedist locally there in the next few weeks as needed. Otherwise, I will see her back here in [place] if she returns to her home situation.  ____________________________ Valinda HoarHoward E. Michaell Grider, MD hem:slb D: 06/23/2011 12:02:58 ET T: 06/23/2011 12:28:37 ET JOB#: [id number]  cc: Valinda HoarHoward E. Kylyn Mcdade, MD, <Dictator> Don C. Candelaria Stagershaplin, MD Valinda HoarHOWARD E Rober Skeels MD ELECTRONICALLY SIGNED  06/24/2011 19:18 

## 2014-04-29 NOTE — Op Note (Signed)
PATIENT NAME:  Carly [name], Carly [name] MR#:  [id number] DATE OF BIRTH:  [birth year]  DATE OF PROCEDURE:  06/14/2011  PREOPERATIVE DIAGNOSIS: Displaced subcapital fracture of the right hip.   POSTOPERATIVE DIAGNOSIS: Displaced subcapital fracture of the right hip.  OPERATION: Right hip hemiarthroplasty (Stryker Accolade number 3.5 femoral stem, 47 mm unipolar head, +4 mm neck length).  SURGEON: Valinda HoarHoward E. Rheana Casebolt, MD   ANESTHESIA: Spinal.   COMPLICATIONS: None.   DRAINS: Two Hemovacs.   ESTIMATED BLOOD LOSS: 200 mL.  REPLACEMENT: None.   DESCRIPTION OF PROCEDURE: The patient was brought to the Operating Room where she underwent satisfactory spinal anesthesia and was placed in the left lateral decubitus position on the beanbag and padded appropriately. The right hip was prepped and draped in sterile fashion. [name] posterolateral incision was made and dissection carried out sharply through subcutaneous tissue and fascia. External rotators were released and tagged. The posterior capsule was incised and tagged. The femoral head was removed measured 46 to 47 mm in size. The acetabulum was cleared of soft tissue and was irrigated. The cartilage had good quality. The trial 46 and 47 mm heads were inserted, and the 47 mm was the appropriate size. The femoral canal was curetted and then increasing sized rasps inserted until [name] 3.5 fit fairly snugly. Attempts to insert [name] #4 stem showed that this was too tight. The 3.5 stem was reinserted. Trial reduction was carried out with [name] standard neck length, and [name] 47 mm head, and then [name] +4 neck length. The +4 neck length provided the best leg length and was stable and had good tension. The trials were removed and the wound thoroughly irrigated and dried. [name] number 3.5 Accolade femoral stem was inserted in as much anteversion as possible and fit very snugly. The 47 mm unipolar head with [name] +4r neck length was attached and the hip was reduced. It was quite stable. The leg lengths were  good. After irrigation, the posterior capsule was closed with #2 Tycron. The posterior short external rotators were repaired with the same suture. The fascia was closed with 0 Vicryl over [name] Hemovac drain, and the subcutaneous tissue was closed with 2-0 Vicryl over another Hemovac. The skin was closed with staples. [name] dry sterile dressing was applied. The Hemovac was activated. The patient was placed in the supine position and leg lengths were good. Range of motion of the hip was good. She was transferred to her hospital bed and taken to recovery in good condition.   ____________________________ Valinda HoarHoward E. Santasia Rew, MD hem:cbb D: 06/14/2011 14:50:40 ET T: 06/15/2011 10:43:02 ET JOB#: [id number]  cc: Valinda HoarHoward E. Jhamir Pickup, MD, <Dictator> Valinda HoarHOWARD E General Wearing MD ELECTRONICALLY SIGNED 06/15/2011 11:08

## 2014-04-29 NOTE — Consult Note (Signed)
PATIENT NAME:  Carly [name], BONENFANT MR#:  [id number] DATE OF BIRTH:  1929-04-09  DATE OF CONSULTATION:  06/13/2011  REFERRING PHYSICIAN:  Dr. Daryel November, ED physician CONSULTING PHYSICIAN:  Stephanie Acre, MD  PRIMARY CARE PHYSICIAN: Dr. Candelaria Stagers  PRIMARY CARDIOLOGIST: Dr. Gwen Pounds   INDICATION FOR ADMISSION: Right femoral fracture.   CHIEF COMPLAINT: "I fell today and broke my hip."  HISTORY OF PRESENT ILLNESS: This is a pleasant 81 year old female with past medical history of hypertension, congestive heart failure, chronic respiratory failure secondary to chronic obstructive pulmonary disease on 4 liters of nasal cannula, mild pulmonary fibrosis, hypertension, mitral valve disorder, atrial fibrillation, osteoarthritis and questionable mild dementia that was admitted with above chief complaint. Patient stated today that she was in her living room walking around her chair when her foot got caught on one of the chair legs and she fell on the carpet. When she fell on the carpet she felt an intense pain. Her husband then called EMS, they brought her to the ED. In the ED she had a pelvic x-ray that showed a displaced right femoral fracture. She was seen by surgery, Dr. Hyacinth Meeker, who is now possibly going to evaluate the patient for surgery and is asking for preoperative clearance.   PAST MEDICAL HISTORY: 1. Congestive heart failure. Patient had an echo June 2007 that showed moderate to severe tricuspid regurgitation and moderate mitral regurgitation but moderate mitral valve prolapse.  2. Chronic respiratory failure on 4 liters of nasal cannula at home secondary to chronic obstructive pulmonary disease.  3. History of mild pulmonary fibrosis.  4. Questionable mild dementia.  5. Hypertension. 6. Mitral valve disorder.  7. Atrial fibrillation not on anticoagulation secondary to falls and gastrointestinal bleed. 8. Osteoarthritis.   HOME MEDICATIONS: 1. Lasix 20 mg q.8 p.r.n.  2. Meclizine 25 mg q.8  p.r.n. for dizziness. 3. Metoprolol 50 mg b.i.d.  4. Diltiazem XT 120 mg p.o. daily. 5. Klor-Con 20 mEq daily.  PAST SURGICAL HISTORY:  1. Appendectomy. 2. Colectomy. 3. Removal of polyps.   ALLERGIES: Allegra, Cipro.   FAMILY HISTORY: Patient states that she doesn't have a significant family history. Both her parents lived into their 39s and died of myocardial infarction.  SOCIAL HISTORY: Smoked a pack a day for about 20 years, quit more than 20 years ago. She is a nondrinker.  REVIEW OF SYSTEMS: CONSTITUTIONAL: No fever, fatigue, weakness. Positive pain in the right hip extending down to the knee. No weight loss, weight gain. EYES: No blurry vision, double vision, pain or redness. ENT: No tinnitus, ear pain, hearing loss or seasonal allergies. RESPIRATORY: No cough, wheeze, hemoptysis. Positive chronic obstructive pulmonary disease. CARDIOVASCULAR: No chest pain, palpitations, arrhythmia. Positive high blood pressure. GASTROINTESTINAL: No nausea, vomiting, diarrhea, abdominal pain, hematemesis, melena, ulcers, gastroesophageal reflux disease. GENITOURINARY: No dysuria, hematuria, renal calculi or incontinence. ENDOCRINE: No polyuria, nocturia, thyroid problems. HEME/LYMPH: No anemia, easy bruising, bleeding, swollen glands. MUSCULOSKELETAL: No pain in neck, back, shoulder. Positive pain in the right hip and knee. Positive for osteoarthritis. NEUROLOGICAL: No numbness, weakness, dysarthria, epilepsy, tremor or vertigo. Admits some memory loss. PSYCH: No anxiety, insomnia, ADD, OCD, bipolar or depression.   PHYSICAL EXAMINATION:  VITAL SIGNS: Blood pressure 165/70, pulse 93, temperature 97.7, oxygen sat 98%, respiratory rate 18.  GENERAL APPEARANCE: Well developed, well nourished Caucasian female laying in bed in no acute respiratory distress, just in moderate pain.   HEENT: Pupils are equal, round, and reactive to light and accommodation. Extraocular movements are intact. No scleral  icterus  or conjunctivitis. TMs are intact. No pharyngeal erythema.   NECK: No thyroid enlargement, tenderness or nodules. Neck is supple and nontender. No adenopathy is noted, JVD or carotid bruits.  RESPIRATORY: Clear to auscultation bilaterally. No rales, rhonchi, crackles. There is mildly diminished breath sounds bilateral bases otherwise no wheezing or labored breathing.   CARDIOVASCULAR: Regular rate, regular rhythm. No murmurs. S1, S2 auscultated.   CHEST: Nontender. Good pedal and radial pulses. No lower extremity edema is noted at this time.   ABDOMEN: Soft, nontender, nondistended. Positive bowel sounds.   MUSCULOSKELETAL: 5/5 strength bilateral upper extremities. Right lower extremity with 3/5 strength. Moderate pain to palpation on the lateral aspect of the right hip extending down to the knee.   SKIN: No rash. Mild bruising noted on the right hip otherwise no erythema or nodules. Skin is warm and dry.   LYMPH: No lymphadenopathy noted in cervical, axillary or supraclavicular regions.  NEUROLOGICAL: Cranial nerves II through XII intact. Sensory is intact.   PSYCH: Alert and oriented to person, time and place. Cooperative. Good judgement is noted.   LABORATORY, DIAGNOSTIC AND RADIOLOGICAL DATA: Sodium 140, potassium 4.3, chloride 104, bicarbonate 26, BUN 23, creatinine 1.38 which is stable from her creatinine in 02/2011 so this is a new baseline of 1.3. Glucose 118, alkaline phosphatase 138, AST 39, ALT 33, hemoglobin 13.5, hematocrit 42.1, platelet count 135, white cell count 9.4. She had a urinalysis done that did show pH  5.0, nitrite positive, leukocyte esterase positive, RBCs 2 and white blood cells positive, 3+ bacteria. EKG shows multiple motion artifact in multiple leads, otherwise normal sinus rhythm with a rate of about 95 beats per minute. No acute ST-T wave elevations or depressions or changes.   She had an x-ray of the pelvis AP that showed minimally displaced right femoral  fracture. She had a chest x-ray that showed no acute disease of the chest. She had a right hip complete that showed mildly displaced right femoral neck fracture.   ASSESSMENT AND PLAN: 81 year old female with past medical history of congestive heart failure, hypertension, atrial fibrillation, chronic obstructive pulmonary disease, mitral valve prolapse now with right femoral fracture being seen by hospitalist for preoperative evaluation.  1. Preoperative evaluation. Patient with cardiac history with congestive heart failure and atrial fibrillation. Last echo done in 2007 with severe TR, moderate MR and moderate mitral valve prolapse. Patient with functional capacity of 4-10 mets prior to fall. Patient at 1-5% for cardiac event for orthopedic surgery. Recommend to give home beta blocker dose prior to surgery. Recommend check another EKG prior and post surgery. Continue with 4 liters of nasal cannula oxygen prior and post surgery. Limit the use of benzodiazepine narcotics after surgery. Will also recommend a formal cardiac evaluation prior to surgery.  2. Hypertension. Elevated at this time most likely secondary to pain. Pain management per surgery. Will continue with home Lasix and metoprolol. 3. History of atrial fibrillation. Currently not on anticoagulation secondary to history of falls and history of GI bleed. Will continue with close monitoring and rate control with metoprolol. 4. Chronic obstructive pulmonary disease/chronic respiratory failure. Will continue with supplemental oxygen. Patient is being followed by [facility]. At this time no acute changes in medications are warranted. 5. Congestive heart failure. Management as stated above. Continue with beta blocker and Lasix. Formal cardiology evaluation. 6. Questionable urinary tract infection. 1+ leukocyte esterase and mild elevation of white cells on the urinalysis and 1+ nitrites. Will get a  urine culture and based on the urine  culture will give antibiotics or not. 7. CODE STATUS: Patient is a FULL CODE.   TIME SPENT DICTATING AND EVALUATING PATIENT: 40 minutes.   ____________________________ Stephanie Acre, MD vm:cms D: 06/13/2011 23:11:46 ET T: 06/15/2011 09:50:08 ET JOB#: [id number] cc: Stephanie Acre, MD, <Dictator> Jimmie Molly. Candelaria Stagers, MD Stephanie Acre MD ELECTRONICALLY SIGNED 06/15/2011 20:20

## 2014-04-29 NOTE — Consult Note (Signed)
    Comments   Carly [name] is currently lying in bed. She is alert, talkative and very pleasant. Complains of mild discomfort in hip and requesting something for relief. Reports sitting up earlier today for "a good while". She reports that her hip started to bother her which is why she requested to lie down. The plan for discharg is that patient will be moving closer to her daughter in [place]. I believe they have found a facility where she and her husband can live in. She reports that initially she had mixed feelings about leaving her home of 60 years. However, being closer to her family will make up for it. I spoke with Marylene Landngela in [place] and she is trying to arrange transportation.   Electronic Signatures: Orville GovernRey, Raquel (RN)  (Signed 17-Jun-13 16:09)  Authored: Palliative Care   Last Updated: 17-Jun-13 16:09 by Orville Governey, Raquel (RN)

## 2014-04-29 NOTE — H&P (Signed)
Subjective/Chief Complaint Pain right hip    History of Present Illness 81 year old female fell at home last night, tripped over stool, injuring the right hip.  Brought to Emergency Room where exam and X-rays show a displaced subcapital fracture right hip.  Admitted for medical evaluation and surgery.  She has been seen and cleared for surgery by medicine and cardiology, Dr Marella Bile.  Discussed options with patient and husband and they wish to proceed with surgery.  Risks and benefits of surgery were discussed at length including but not limited to infection, non union, nerve or blood vessed damage, non union, need for repeat surgery, blood clots and lung emboli, and death.  Plan surgery today. History of congestive heart failure, chronic obstructive pulmonary disease, atrial fibrillation. No coumadin.    Primary Physician [facility]   Past Med/Surgical Hx:  Hip Fracture - Right:   Diverticulitis:   mumps:   Shingles:   htn:   afib:   cardioversion:   pmr:   rotator cuff tear:   rheumatic fever:   appendectomy:   ALLERGIES:  Cipro: GI Distress, Other  Allegra: Unknown  HOME MEDICATIONS: Medication Instructions Status  multivitamin 1 tab(s) orally once a day. **complete senior** Active  metoprolol tartrate 50 mg oral tablet 1 tab(s) orally 2 times a day Active  donepezil 5 mg oral tablet 1 tab(s) orally once a day (at bedtime) Active  Diltiazem Hydrochloride CD 120 mg/24 hours oral capsule, extended release 1 cap(s) orally once a day Active  meclizine 12.5 mg oral tablet 1 tab(s) orally 3 times a day as needed for dizziness Active  oxygen 4 liter(s) via [place] at (at bedtime) and during the day as needed for breathing. Active  Klor-Con 20 mEq oral powder for reconstitution 1 packet(s) with water and drink orally once a day Active   Family and Social History:   Family History Non-Contributory    Social History negative tobacco, negative ETOH    Place of Living Home   Review of  Systems:   Fever/Chills No    Cough No    Sputum No    Abdominal Pain No   Physical Exam:   GEN well developed, well nourished, no acute distress    HEENT pink conjunctivae    NECK supple    RESP normal resp effort    CARD regular rate    ABD denies tenderness    GU foley catheter in place    LYMPH negative neck    EXTR negative edema, right leg pain with range of motion at hip.  circulation/sensation/motor function  good.  skin intact.  short and rotated.    SKIN normal to palpation    PSYCH alert, A+O to time, place, person, good insight   Lab Results: Hepatic:  08-Jun-13 20:52    Bilirubin, Total 0.8   Alkaline Phosphatase  138   SGPT (ALT) 33 (12-78 NOTE: NEW REFERENCE RANGE 11/28/2010)   SGOT (AST)  39   Total Protein, Serum 7.7   Albumin, Serum 3.8  Routine BB:  08-Jun-13 20:52    Crossmatch Unit 1 Ready   Crossmatch Unit 2 Ready (Result(s) reported on 13 Jun 2011 at 10:13PM.)   ABO Group + Rh Type O Positive   Antibody Screen NEGATIVE (Result(s) reported on 13 Jun 2011 at 09:57PM.)  Routine Chem:  08-Jun-13 20:52    Glucose, Serum  118   BUN  23   Creatinine (comp)  1.38   Sodium, Serum 140  Potassium, Serum 4.2   Chloride, Serum 104   CO2, Serum 26   Calcium (Total), Serum 9.3   Anion Gap 10   Osmolality (calc) 284   eGFR (African American)  42   eGFR (Non-African American)  36 (eGFR values <85m/min/1.73 m2 may be an indication of chronic kidney disease (CKD). Calculated eGFR is useful in patients with stable renal function. The eGFR calculation will not be reliable in acutely ill patients when serum creatinine is changing rapidly. It is not useful in  patients on dialysis. The eGFR calculation may not be applicable to patients at the low and high extremes of body sizes, pregnant women, and vegetarians.)  09-Jun-13 06:50    Glucose, Serum  125   BUN  24   Creatinine (comp) 1.23   Sodium, Serum 141   Potassium, Serum 4.6    Chloride, Serum 103   CO2, Serum 29   Calcium (Total), Serum 8.7   Anion Gap 9   Osmolality (calc) 287   eGFR (African American)  48   eGFR (Non-African American)  41 (eGFR values <616mmin/1.73 m2 may be an indication of chronic kidney disease (CKD). Calculated eGFR is useful in patients with stable renal function. The eGFR calculation will not be reliable in acutely ill patients when serum creatinine is changing rapidly. It is not useful in  patients on dialysis. The eGFR calculation may not be applicable to patients at the low and high extremes of body sizes, pregnant women, and vegetarians.)  Routine UA:  08-Jun-13 21:18    Color (UA) Yellow   Clarity (UA) Hazy   Glucose (UA) Negative   Bilirubin (UA) Negative   Ketones (UA) Negative   Specific Gravity (UA) 1.014   Blood (UA) Negative   pH (UA) 5.0   Protein (UA) Negative   Nitrite (UA) Positive   Leukocyte Esterase (UA) 1+ (Result(s) reported on 13 Jun 2011 at 09:48PM.)   RBC (UA) 2 /HPF   WBC (UA) 23 /HPF   Bacteria (UA) 3+   Epithelial Cells (UA) NONE SEEN   Hyaline Cast (UA) 5 /LPF (Result(s) reported on 13 Jun 2011 at 09:48PM.)  Routine Coag:  08-Jun-13 20:52    Prothrombin 12.3   INR 0.9 (INR reference interval applies to patients on anticoagulant therapy. A single INR therapeutic range for coumarins is not optimal for all indications; however, the suggested range for most indications is 2.0 - 3.0. Exceptions to the INR Reference Range may include: Prosthetic heart valves, acute myocardial infarction, prevention of myocardial infarction, and combinations of aspirin and anticoagulant. The need for a higher or lower target INR must be assessed individually. Reference: The Pharmacology and Management of the Vitamin K  antagonists: the seventh ACCP Conference on Antithrombotic and Thrombolytic Therapy. ChHUDJS.9702ept:126 (3suppl): [id number] HCT value >55% may artifactually increase the PT.  In one study,   the increase was an average of 25%. Reference:  "Effect on Routine and Special Coagulation Testing Values of Citrate Anticoagulant Adjustment in Patients with High HCT Values." American Journal of Clinical Pathology 2006;126:400-405.)  Routine Hem:  08-Jun-13 20:52    WBC (CBC) 9.4   RBC (CBC) 4.50   Hemoglobin (CBC) 13.5   Hematocrit (CBC) 42.1   Platelet Count (CBC)  135 (Result(s) reported on 13 Jun 2011 at 09:37PM.)   MCV 94   MCH 30.0   MCHC 32.0   RDW 13.2  09-Jun-13 06:50    WBC (CBC)  12.9   RBC (CBC) 4.28   Hemoglobin (  CBC) 13.1   Hematocrit (CBC) 40.1   Platelet Count (CBC)  122   MCV 94   MCH 30.6   MCHC 32.6   RDW 13.1   Neutrophil % 84.0   Lymphocyte % 7.7   Monocyte % 7.3   Eosinophil % 0.5   Basophil % 0.5   Neutrophil #  10.8   Lymphocyte # 1.0   Monocyte # 0.9   Eosinophil # 0.1   Basophil # 0.1 (Result(s) reported on 14 Jun 2011 at 07:33AM.)     Assessment/Admission Diagnosis Displaced right subcapital hip fracture    Plan Right hip hemiarthroplasty   Electronic Signatures: Park Breed (MD)  (Signed 09-Jun-13 10:30)  Authored: CHIEF COMPLAINT and HISTORY, PAST MEDICAL/SURGIAL HISTORY, ALLERGIES, HOME MEDICATIONS, FAMILY AND SOCIAL HISTORY, REVIEW OF SYSTEMS, PHYSICAL EXAM, LABS, ASSESSMENT AND PLAN   Last Updated: 09-Jun-13 10:30 by Park Breed (MD)

## 2014-04-29 NOTE — Consult Note (Signed)
Con sult dictated, briefely, 82 YOWF came with a fall with right hip fracture, and h/o moderate MR/CHF/Atrial fibrillation. EKG shows Afib with VR 95, PRWP due tp old ASWMI, RAD and low voltage. Patient denies chest pain, shortness of breath, but complaing of right hip pain. Patient has compensated CHF, adequetly controlled afib,and is moderate risk for surgery. Advise proceeding with surgery.  Electronic Signatures: Radene KneeKhan, Ossie Yebra Ali (MD)  (Signed on 09-Jun-13 09:57)  Authored  Last Updated: 09-Jun-13 09:57 by Radene KneeKhan, Nkosi Cortright Ali (MD)

## 2014-04-29 NOTE — Discharge Summary (Signed)
PATIENT NAME:  Carly [name], Carly [name] MR#:  [id number] DATE OF BIRTH:  1929/01/30  DATE OF ADMISSION:  01/27/2011 DATE OF DISCHARGE:  02/04/2011  HISTORY OF PRESENT ILLNESS:  Carly [name] is a 81 year old Caucasian female with a history of heart failure, mitral valve disease, chronic atrial fibrillation, pulmonary hypertension, who had a significant life-threatening gastrointestinal bleed due to diverticulosis and which she is no longer and was not on anticoagulants. She presented to the Emergency Room with progressive shortness of breath, sputum production. She had been seen in the office by me and started on antibiotic with failure to thrive. In the Emergency Room, she was found to be quite hypoxic on walking down to 72% oximetry on room air. She was admitted for what appeared to be pneumonia, chronic obstructive pulmonary disease, possibly some heart failure, with profound hypoxia.   HOSPITAL COURSE: She was very slow to progress. She was seen early on by her cardiologist, who felt her atrial fibrillation was reasonably controlled on her current medications and did not feel like she was in any heart failure. Despite antibiotics and pulmonary therapy, eventually steroids were added to the program with some slow extra improvement. Her CAT scan of the chest did not show any sign of pulmonary embolus, raised the possibility this was more related to chronic obstructive pulmonary disease. She was eventually seen by a pulmonologist, Dr. Raul Del, who felt her program was progressing satisfactorily. No additional intervention was needed. Just prior to discharge, her oxygen level was in the low 80s with ambulation, even at rest. Her oximetry readings were 86%. One liter did appear to maintain pretty good oximetry readings in the 93%.   Prior do discharge, blood pressure was running 115/80 to 123/80 to 121/70 in orthostatic measurements. These were all felt to be successfully compensated.   At the time of admission, her  glucose was 88, BUN 10, creatinine 1.15, sodium 141, potassium 3.8, chloride 101, bicarbonate was 20, EGFR was 48, calcium was 8.6. White count was 6500, hemoglobin 11.6. Oximetry ranged from 89 to 87 on room air at rest, had been demonstrated to drop to 74 with ambulation.   At the time of discharge, sugar was 87, BUN was 28, creatinine 1.07, sodium 139, potassium 3.9, chloride 99, EGFR was 53. White count was 13.8, hemoglobin was 12.4. Oximetry was 87% on room air at rest.  Admitting chest x-ray was compatible with pneumonia. Follow-up chest x-ray showed some slight improvement. EKG was atrial fibrillation with premature ventricular contractions. Blood cultures x2 grew out no organism. Sputum grew out a heavy growth basically of normal respiratory flora.   DISCHARGE DIAGNOSES:  1. Acute community-acquired pneumonia. She had already been started on antibiotics prior to admission.  2. Acute on chronic respiratory failure.  3. Chronic atrial fibrillation. 4. Valvular cardiomyopathy.  5. Severe hypoxia.   DISCHARGE MEDICATIONS:  1. Metaproterenol 50 mg b.i.d., a lower dose from the 100 b.i.d. she had been on.  2. Cardizem ER 120 mg, also a lower dose than prior to admission.  3. Potassium 20 mEq daily.  4. Meclizine 12.5 every 8 hours p.r.n. for chronic benign vertigo syndrome. 5. Advair 250/50 b.i.d., a new medication.  6. Nystatin 1000 units 5 mL swish and swallow q.i.d. for monilia of the throat related to her antibiotics. 7. Ceftin 500 mg b.i.d.  for an additional week.  8. Prednisone 30 mg with a 5 mg taper to discontinue. 9. O2 at 2 liters nasal prongs.   DIET: Low sodium diet.  FOLLOWUP:  1. She will have a followup with Home Health, Home Physical Therapy, home nurse to monitor medication adjustments.  2. Follow-up appointment with her pulmonologist, Dr. Raul Del, on 02/14/2011 at 10:15. 3. Follow-up with her primary care doctor, Dr. Mable Fill, on 01/17/2011 in the afternoon.   4. Follow-up with her cardiologist, Dr. Nehemiah Massed, on 03/03/2011.        PROGNOSIS: Overall prognosis remains guarded.   ____________________________ Dianah Field Mable Fill, MD dcc:cbb D: 02/05/2011 07:35:53 ET T: 02/05/2011 13:09:01 ET JOB#: [id number]  cc: Don C. Mable Fill, MD, <Dictator> Corey Skains, MD Herbon E. Raul Del, MD Tawni Millers MD ELECTRONICALLY SIGNED 02/11/2011 7:26

## 2014-04-29 NOTE — H&P (Signed)
PATIENT NAME:  Carly Carly [name], Carly Carly [name] MR#:  [id number] DATE OF BIRTH:  08-26-1929  DATE OF ADMISSION:  01/27/2011  PRIMARY CARE PHYSICIAN: Conchita Parison Chaplin, MD  CARDIOLOGIST: Arnoldo HookerBruce Kowalski, MD  CHIEF COMPLAINT: I have pneumonia.   HISTORY OF PRESENT ILLNESS: This is Carly [name] 81 year old female with past medical history of heart failure, hypertension, mitral valve disease, chronic atrial fibrillation, and pulmonary hypertension. She presents with Carly [name] few weeks of choking on phlegm, coughing up greenish phlegm, and shortness of breath. She saw Dr. Candelaria Stagershaplin yesterday and was given Carly [name] Z-Pak, Hycodan and Mucinex. She has also had decreased appetite. She got scared this morning with shortness of breath and came in to the Emergency Room for further evaluation where she was found to be hypoxic at 72% with walking. Hospitalist services were contacted for further evaluation.   PAST MEDICAL HISTORY:  1. Congestive heart failure. 2. Hypertension. 3. Mitral valve disease. 4. Chronic atrial fibrillation. 5. Pulmonary hypertension. 6. Osteoporosis.  7. Polymyalgia rheumatica.  8. History of gastrointestinal bleed.   PAST SURGICAL HISTORY:  1. Appendectomy.  2. Abdominal obstruction. 3. Colectomy.   ALLERGIES: Cipro and Allegra.    MEDICATIONS:  1. Lasix 20 mg daily.  2. Metoprolol 100 mg twice Carly [name] day. 3. Cardia XT 180 mg daily.  4. Meclizine p.r.n. 5. Recently prescribed Carly [name] Z-Pak, Hycodan and Mucinex.   SOCIAL HISTORY: She quit smoking 25 years ago, no alcohol. 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   FAMILY HISTORY: Mother and father both died in their 1980s of Carly [name] heart attack.  REVIEW OF SYSTEMS: CONSTITUTIONAL: Positive for weight loss. No fever, chills, or sweats. Positive for fatigue. EYES: No eye issues. EARS, NOSE, MOUTH, AND THROAT: Decreased hearing. No sore throat. No difficulty swallowing. CARDIOVASCULAR: No chest pain. No palpitations. RESPIRATORY: Positive for shortness of breath. Positive for coughing up greenish  phlegm. No wheezing. No hemoptysis. GASTROINTESTINAL: Positive for vomiting Carly [name] few times. No abdominal pain, no diarrhea, no constipation, no bright red blood per rectum, and no melena. GENITOURINARY: No burning on urination. No hematuria. MUSCULOSKELETAL: No joint pain or muscle pain. INTEGUMENT: No rashes or eruptions. NEUROLOGIC: No fainting or blackouts. PSYCHIATRIC: Positive for anxiety and depression. ENDOCRINE: No thyroid problems. HEMATOLOGIC/LYMPHATIC: No anemia.   PHYSICAL EXAMINATION:   VITAL SIGNS: Blood pressure is 137/71, respirations 18, pulse 95, and temperature 96.7. The patient is slightly orthostatic with blood pressure 101/51 on standing. Pulse oximetry is 94% on oxygen and as per ER physician was 72% with walking around.  GENERAL: No respiratory distress.   EYES: Blood shot in the right eye, medially. Lids are normal. Pupils equal, round, and reactive to light. No nystagmus.   EARS, NOSE, MOUTH, AND THROAT: Nasal mucosa no erythema. Throat no erythema. No exudate seen. Lips and gums normal.   NECK: No JVD. No bruits. No lymphadenopathy. No thyromegaly. No thyroid nodules palpated.   RESPIRATORY: Decreased breath sounds bilaterally. Positive rhonchi in bilateral bases.   HEART: S1 and S2 normal, irregularly irregular, 2/6 systolic ejection murmur. Carotid upstroke 2+ bilaterally. No bruits.   EXTREMITIES: Dorsalis pedis pulses 2+ bilaterally. Trace edema of the lower extremities.   ABDOMEN: Soft and nontender. No organomegaly/splenomegaly. Normoactive bowel sounds. No masses felt.   LYMPHATIC: No lymph nodes in the neck.   MUSCULOSKELETAL: Trace edema. No clubbing. No cyanosis. On oxygen.   SKIN: No ulcers or lesions seen.   NEUROLOGIC: Cranial nerves II through XII grossly intact. Deep tendon reflexes 1+ in bilateral lower extremities.   PSYCHIATRIC: The patient  is oriented to person, place, and time.   LABS/STUDIES: Chest x-ray shows Carly [name] left lower lobe  pneumonia.  Glucose 108, BUN 15, creatinine 1.23, sodium 132, potassium 3.3, chloride 93, CO2 27, calcium 9.5, and GFR 45. White blood cell count 10.0, hemoglobin and hematocrit 13.1 and 39.1, and platelet count 246. Troponin negative.   ASSESSMENT AND PLAN:  1. Acute respiratory failure with hypoxia: We will give oxygen supplementation.  2. Pneumonia: We will give Rocephin and Zithromax. Blood cultures ordered by ER physician, drawn before antibiotics.  3. Atrial fibrillation: Rate controlled. Continue the metoprolol and Cardia. I guess the patient is not on any anticoagulation secondary to GI bleed history.  4. History of congestive heart failure: No signs of congestive heart failure at this point. We will hold Lasix today and restart tomorrow since given Carly [name] fluid bolus today.  5. History of gastrointestinal bleed: Hold off on blood thinners. We will use SCDs and TEDs for deep vein thrombosis prophylaxis.  6. Polymyalgia rheumatica: Not on any medications for this.  7. Osteoporosis: Not on any medications for this.  8. Hypokalemia: We will replace potassium orally and recheck in the Carly [name].m.   TIME SPENT ON ADMISSION: 55 minutes.  ____________________________ Herschell Dimes. Renae Gloss, MD rjw:slb D: 01/27/2011 10:35:38 ET T: 01/27/2011 11:14:41 ET JOB#: [id number]  cc: Herschell Dimes. Renae Gloss, MD, <Dictator> Don C. Candelaria Stagers, MD Salley Scarlet MD ELECTRONICALLY SIGNED 01/29/2011 15:59
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
## 2014-04-29 NOTE — Consult Note (Signed)
PATIENT NAME:  Carly [name], Carly [name] MR#:  [id number] DATE OF BIRTH:  September 10, 1929  DATE OF CONSULTATION:  06/14/2011  REFERRING PHYSICIAN:   CONSULTING PHYSICIAN:  Carly [name]. Carly Sandiford, MD  REASON FOR CONSULTATION:  I was asked to evaluate the patient for preoperative clearance because I am on Emergency Room call and they are unable to get in touch for some reason with this patient's cardiologist. Dr. Gwen [name] is her cardiologist, but apparently she needs emergently to go for surgery and they asked me to evaluate the patient. Dr. Gwen [name] will be following the patient after this. The patient has [name] right hip fracture and I was asked to evaluate the patient for preoperative clearance.   HISTORY OF PRESENT ILLNESS: This is an 81 year old white female with [name] past medical history of hypertension, atrial fibrillation, congestive heart failure, chronic obstructive pulmonary disease, who apparently fell and has [name] right femoral fracture. Dr. Hyacinth [name] is going to operate and I was asked to evaluate the patient. She denies any chest pain, shortness of breath, PND, orthopnea, or leg swelling.   PAST MEDICAL HISTORY:  1. History of hypertension.  2. History of atrial fibrillation. 3. History of chronic obstructive pulmonary disease. 4. History of congestive heart failure with echocardiogram done in 2007 showing severe TR, moderate mitral regurgitation, moderate mitral valve prolapse   SOCIAL HISTORY: Unremarkable.   FAMILY HISTORY: Positive for coronary artery disease.   PHYSICAL EXAMINATION:  VITAL SIGNS: Pulse right now is 80, respirations 14, blood pressure 135/78, and saturation 95.   NECK: No JVD.   LUNGS: Clear.   HEART: Irregularly irregular pulse. No audible murmur.   ABDOMEN: Soft, nontender, positive bowel sounds.   EXTREMITIES: No pedal edema.   NEUROLOGIC: She appears to be intact.   LABORATORY, RADIOLOGICAL AND DIAGNOSTIC DATA: EKG shows atrial fibrillation at 95 beats per minute, nonspecific ST-T  changes, poor R wave progression, suggestive of old anteroseptal wall myocardial infarction. BUN and creatinine are 24/1.23. Hemoglobin and hematocrit are 13.1/40.1   ASSESSMENT AND PLAN: The patient has atrial fibrillation with management with rate control. She is asymptomatic. She denies any chest pain or shortness of breath. She is not in heart failure. She is currently on beta blocker, metoprolol 50 b.i.d. and Cardizem extended-release 120 daily and Lasix 20 mg p.o. daily, thus she is clinically stable. Advise proceeding with surgery.  ____________________________ Carly [name]. Carly Padron, MD sak:ap D: 06/14/2011 09:52:03 ET T: 06/14/2011 11:20:36 ET JOB#: [id number]  cc: Carly [name]. Carly Hisle, MD, <Dictator> Carly [name] Carly Gafford MD ELECTRONICALLY SIGNED 06/15/2011 15:48

## 2014-04-29 NOTE — Consult Note (Signed)
Brief Consult Note: Diagnosis: Cognitive disorder NOS, R/O delirium 2/2 medical problems/medications.   Patient was seen by consultant.   Consult note dictated.   Recommend further assessment or treatment.   Orders entered.   Discussed with Attending MD.   Comments: [name] has no psychiatric history except mild cognitive decline. She was admitted for fx hip. She reports some confusion and visual hallucinations, especially at night.  PLAN: 1. I will start low dose haldol at night for delirium.   2. The patient awaits transfer to SNF for further rehab.  3. I will follow up.  Electronic Signatures: Kristine LineaPucilowska, Mackenzy Grumbine (MD)  (Signed 17-Jun-13 14:44)  Authored: Brief Consult Note   Last Updated: 17-Jun-13 14:44 by Kristine LineaPucilowska, Deyra Perdomo (MD)
